# Patient Record
Sex: Male | Born: 1970
Health system: Southern US, Community
[De-identification: ages and names within clinical notes are randomized; demographics above are authoritative.]

## PROBLEM LIST (undated history)

## (undated) DIAGNOSIS — I10 Essential (primary) hypertension: Secondary | ICD-10-CM

## (undated) DIAGNOSIS — K5792 Diverticulitis of intestine, part unspecified, without perforation or abscess without bleeding: Secondary | ICD-10-CM

## (undated) DIAGNOSIS — F319 Bipolar disorder, unspecified: Secondary | ICD-10-CM

## (undated) DIAGNOSIS — Z8619 Personal history of other infectious and parasitic diseases: Secondary | ICD-10-CM

## (undated) DIAGNOSIS — E782 Mixed hyperlipidemia: Secondary | ICD-10-CM

## (undated) DIAGNOSIS — H348392 Tributary (branch) retinal vein occlusion, unspecified eye, stable: Secondary | ICD-10-CM

## (undated) DIAGNOSIS — R7303 Prediabetes: Secondary | ICD-10-CM

## (undated) DIAGNOSIS — G4730 Sleep apnea, unspecified: Secondary | ICD-10-CM

## (undated) HISTORY — PX: COLOSTOMY REVERSAL: SHX5782

## (undated) HISTORY — PX: ABDOMINAL SURGERY: SHX537

## (undated) HISTORY — DX: Essential (primary) hypertension: I10

## (undated) HISTORY — DX: Prediabetes: R73.03

## (undated) HISTORY — DX: Sleep apnea, unspecified: G47.30

## (undated) HISTORY — DX: Mixed hyperlipidemia: E78.2

## (undated) HISTORY — PX: COLON RESECTION: SHX5231

## (undated) HISTORY — DX: Personal history of other infectious and parasitic diseases: Z86.19

---

## 2004-04-28 ENCOUNTER — Ambulatory Visit: Payer: Self-pay | Admitting: General Surgery

## 2007-06-07 ENCOUNTER — Inpatient Hospital Stay: Payer: Self-pay | Admitting: Internal Medicine

## 2007-06-07 ENCOUNTER — Other Ambulatory Visit: Payer: Self-pay

## 2007-07-03 ENCOUNTER — Ambulatory Visit: Payer: Self-pay | Admitting: Family Medicine

## 2008-02-13 ENCOUNTER — Ambulatory Visit: Payer: Self-pay | Admitting: Family Medicine

## 2008-03-11 ENCOUNTER — Ambulatory Visit: Payer: Self-pay | Admitting: General Surgery

## 2008-03-25 ENCOUNTER — Inpatient Hospital Stay: Payer: Self-pay | Admitting: General Surgery

## 2008-06-29 ENCOUNTER — Ambulatory Visit: Payer: Self-pay | Admitting: General Surgery

## 2008-09-02 ENCOUNTER — Ambulatory Visit: Payer: Self-pay | Admitting: General Surgery

## 2008-09-15 ENCOUNTER — Ambulatory Visit: Payer: Self-pay | Admitting: General Surgery

## 2008-09-24 ENCOUNTER — Inpatient Hospital Stay: Payer: Self-pay | Admitting: General Surgery

## 2008-10-08 ENCOUNTER — Inpatient Hospital Stay: Payer: Self-pay | Admitting: Psychiatry

## 2010-04-11 ENCOUNTER — Emergency Department (HOSPITAL_COMMUNITY)
Admission: EM | Admit: 2010-04-11 | Discharge: 2010-04-12 | Disposition: A | Discharge status: Other Acute Inpatient Hospital | Payer: Self-pay | Source: Home / Self Care | Admitting: Emergency Medicine

## 2010-04-12 ENCOUNTER — Inpatient Hospital Stay (HOSPITAL_COMMUNITY)
Admission: AD | Admit: 2010-04-12 | Discharge: 2010-04-16 | Payer: Self-pay | Source: Home / Self Care | Attending: Psychiatry | Admitting: Psychiatry

## 2010-05-16 NOTE — Discharge Summary (Signed)
  NAME:  Dale Hodges, Dale Hodges              ACCOUNT NO.:  [id number]  MEDICAL RECORD NO.:  [id number]          PATIENT TYPE:  IPS  LOCATION:  0407                          FACILITY:  [facility]  PHYSICIAN:  Eulogio Ditch, MD DATE OF BIRTH:  Jul 13, 1970  DATE OF ADMISSION:  04/12/2010 DATE OF DISCHARGE:  04/16/2010                              DISCHARGE SUMMARY   HISTORY OF PRESENT ILLNESS:  A 40 year old male, who was admitted voluntarily as he was not sleeping properly.  He was sleeping only 4 hours for the last 4 days.  He had also taken Depakote that was given to him by another relative.  The patient's wife reported that he had gone for several days without taking his lithium, not sleeping and getting very anxious.  He was unable to clearly articulate his reason for being here himself.  He was making some nonsensical statements.  He was also hyperreligious and restless at the time of admission.  During the hospital stay, the patient was started back on the lithium 300 mg twice a day.  The patient has been taking lithium for many years and had a good response to the lithium.  The patient was also on one-to- one for inappropriate behavior, which was discontinued on April 09, 2010.  On April 15, 2010, the patient was also started on Seroquel 100 mg p.o. bedtime.  On April 16, 2010, as the patient was feeling much better, psychiatrically stable, not suicidal or homicidal, not having any psychotic or manic symptoms, the patient was discharged to follow up in the outpatient setting.  DISCHARGE DIAGNOSES:  Bipolar disorder, type 1.  DISCHARGE MEDICATIONS:  Lithium 300 mg twice daily, Seroquel 100 mg by mouth at bedtime.  DISCHARGE FOLLOWUP:  The patient will follow up with Dr. Tomasa Rand. The patient will call Dr. Tomasa Rand to make an appointment.     Eulogio Ditch, MD     SA/MEDQ  D:  05/12/2010  T:  05/12/2010  Job:  [id number]  Electronically Signed by Eulogio Ditch  on 05/16/2010 06:20:16 PM

## 2010-06-27 LAB — CBC
HCT: 47.2 % (ref 39.0–52.0)
Hemoglobin: 15.9 g/dL (ref 13.0–17.0)
MCH: 30.6 pg (ref 26.0–34.0)
MCHC: 33.7 g/dL (ref 30.0–36.0)
MCV: 90.8 fL (ref 78.0–100.0)
Platelets: 244 10*3/uL (ref 150–400)
RBC: 5.2 MIL/uL (ref 4.22–5.81)
RDW: 13.6 % (ref 11.5–15.5)
WBC: 15.5 10*3/uL — ABNORMAL HIGH (ref 4.0–10.5)

## 2010-06-27 LAB — DIFFERENTIAL
Basophils Absolute: 0 10*3/uL (ref 0.0–0.1)
Basophils Relative: 0 % (ref 0–1)
Eosinophils Absolute: 0.1 10*3/uL (ref 0.0–0.7)
Eosinophils Relative: 0 % (ref 0–5)
Lymphocytes Relative: 19 % (ref 12–46)
Lymphs Abs: 3 10*3/uL (ref 0.7–4.0)
Monocytes Absolute: 1 10*3/uL (ref 0.1–1.0)
Monocytes Relative: 6 % (ref 3–12)
Neutro Abs: 11.5 10*3/uL — ABNORMAL HIGH (ref 1.7–7.7)
Neutrophils Relative %: 74 % (ref 43–77)

## 2010-06-27 LAB — RAPID URINE DRUG SCREEN, HOSP PERFORMED
Amphetamines: NOT DETECTED
Barbiturates: NOT DETECTED
Benzodiazepines: NOT DETECTED
Cocaine: NOT DETECTED
Opiates: NOT DETECTED
Tetrahydrocannabinol: NOT DETECTED

## 2010-06-27 LAB — BASIC METABOLIC PANEL
BUN: 11 mg/dL (ref 6–23)
CO2: 25 mEq/L (ref 19–32)
Calcium: 9.2 mg/dL (ref 8.4–10.5)
Chloride: 103 mEq/L (ref 96–112)
Creatinine, Ser: 1.02 mg/dL (ref 0.4–1.5)
GFR calc Af Amer: >60 mL/min (ref >60)
GFR calc non Af Amer: >60 mL/min (ref >60)
Glucose, Bld: 120 mg/dL — ABNORMAL HIGH (ref 70–99)
Potassium: 3.5 mEq/L (ref 3.5–5.1)
Sodium: 138 mEq/L (ref 135–145)

## 2010-06-27 LAB — URINALYSIS, ROUTINE W REFLEX MICROSCOPIC
Bilirubin Urine: NEGATIVE
Glucose, UA: NEGATIVE mg/dL
Hgb urine dipstick: NEGATIVE
Nitrite: NEGATIVE
Protein, ur: NEGATIVE mg/dL
Specific Gravity, Urine: 1.004 — ABNORMAL LOW (ref 1.005–1.030)
Urobilinogen, UA: 0.2 mg/dL (ref 0.0–1.0)
pH: 5.5 (ref 5.0–8.0)

## 2010-06-27 LAB — LITHIUM LEVEL: Lithium Lvl: <0.25 mEq/L — ABNORMAL LOW (ref 0.80–1.40)

## 2010-06-27 LAB — VALPROIC ACID LEVEL: Valproic Acid Lvl: 41.6 ug/mL — ABNORMAL LOW (ref 50.0–100.0)

## 2010-06-27 LAB — ETHANOL: Alcohol, Ethyl (B): <5 mg/dL (ref 0–10)

## 2013-01-12 ENCOUNTER — Encounter (HOSPITAL_BASED_OUTPATIENT_CLINIC_OR_DEPARTMENT_OTHER): Payer: Self-pay | Admitting: *Deleted

## 2013-01-12 ENCOUNTER — Emergency Department (HOSPITAL_BASED_OUTPATIENT_CLINIC_OR_DEPARTMENT_OTHER)
Admission: EM | Admit: 2013-01-12 | Discharge: 2013-01-12 | Disposition: A | Discharge status: Home / Self Care | Payer: BC Managed Care – PPO | Attending: Emergency Medicine | Admitting: Emergency Medicine

## 2013-01-12 DIAGNOSIS — S61509A Unspecified open wound of unspecified wrist, initial encounter: Secondary | ICD-10-CM | POA: Insufficient documentation

## 2013-01-12 DIAGNOSIS — S61411A Laceration without foreign body of right hand, initial encounter: Secondary | ICD-10-CM

## 2013-01-12 DIAGNOSIS — F319 Bipolar disorder, unspecified: Secondary | ICD-10-CM | POA: Insufficient documentation

## 2013-01-12 DIAGNOSIS — Z23 Encounter for immunization: Secondary | ICD-10-CM | POA: Insufficient documentation

## 2013-01-12 DIAGNOSIS — W268XXA Contact with other sharp object(s), not elsewhere classified, initial encounter: Secondary | ICD-10-CM | POA: Insufficient documentation

## 2013-01-12 DIAGNOSIS — Z87891 Personal history of nicotine dependence: Secondary | ICD-10-CM | POA: Insufficient documentation

## 2013-01-12 DIAGNOSIS — Y9389 Activity, other specified: Secondary | ICD-10-CM | POA: Insufficient documentation

## 2013-01-12 DIAGNOSIS — Z8719 Personal history of other diseases of the digestive system: Secondary | ICD-10-CM | POA: Insufficient documentation

## 2013-01-12 DIAGNOSIS — Y929 Unspecified place or not applicable: Secondary | ICD-10-CM | POA: Insufficient documentation

## 2013-01-12 DIAGNOSIS — Z79899 Other long term (current) drug therapy: Secondary | ICD-10-CM | POA: Insufficient documentation

## 2013-01-12 HISTORY — DX: Diverticulitis of intestine, part unspecified, without perforation or abscess without bleeding: K57.92

## 2013-01-12 HISTORY — DX: Bipolar disorder, unspecified: F31.9

## 2013-01-12 MED ORDER — TETANUS-DIPHTH-ACELL PERTUSSIS 5-2.5-18.5 LF-MCG/0.5 IM SUSP
0.5000 mL | Freq: Once | INTRAMUSCULAR | Status: AC
  Administered 2013-01-12: 0.5 mL via INTRAMUSCULAR
  Filled 2013-01-12: qty 0.5

## 2013-01-12 NOTE — ED Notes (Signed)
PA at bedside suturing pt. 

## 2013-01-12 NOTE — ED Provider Notes (Signed)
CSN: [id number]     Arrival date & time 01/12/13  1544 History   First MD Initiated Contact with Patient 01/12/13 1547     Chief Complaint  Patient presents with  . Extremity Laceration   (Consider location/radiation/quality/duration/timing/severity/associated sxs/prior Treatment) HPI Comments: Patient presents with complaint of right hand and wrist laceration that began acutely just prior to arrival when he was working on a chimney and the metal insert slice into his hand. Patient rinsed the wound with water. No other treatments prior to arrival other than pressure. Patient denies numbness, tingling, weakness. Onset of symptoms acute. Course is constant. Nothing makes symptoms better worse.  The history is provided by the patient.    Past Medical History  Diagnosis Date  . Bipolar 1 disorder   . Diverticulitis    Past Surgical History  Procedure Laterality Date  . Colon resection    . Colostomy reversal     No family history on file. History  Substance Use Topics  . Smoking status: Former Games developer  . Smokeless tobacco: Never Used  . Alcohol Use: Yes     Comment: occasional    Review of Systems  Constitutional: Negative for activity change.  HENT: Negative for neck pain.   Musculoskeletal: Negative for back pain, joint swelling, arthralgias and gait problem.  Skin: Positive for wound.  Neurological: Negative for weakness and numbness.    Allergies  Codeine  Home Medications   Current Outpatient Rx  Name  Route  Sig  Dispense  Refill  . lithium 600 MG capsule   Oral   Take 600 mg by mouth 2 (two) times daily with a meal.         . QUEtiapine (SEROQUEL) 50 MG tablet   Oral   Take 50 mg by mouth at bedtime.          BP 153/102  Pulse 83  Temp(Src) 99.3 F (37.4 C) (Oral)  Resp 20  Ht 5\' 9"  (1.753 m)  Wt 215 lb (97.523 kg)  BMI 31.74 kg/m2  SpO2 99% Physical Exam  Nursing note and vitals reviewed. Constitutional: He appears well-developed and  well-nourished.  HENT:  Head: Normocephalic and atraumatic.  Eyes: Conjunctivae are normal.  Neck: Normal range of motion. Neck supple.  Cardiovascular: Normal pulses.   Musculoskeletal: He exhibits tenderness. He exhibits no edema.       Right elbow: Normal.He exhibits normal range of motion.       Right wrist: He exhibits laceration. He exhibits normal range of motion.       Right hand: He exhibits tenderness and laceration. He exhibits normal range of motion, no bony tenderness, normal capillary refill and no deformity. Normal sensation noted. Normal strength noted.       Hands: 3 cm, curved, gaping flap, right lateral wrist. Wound is shallow and there is no tendon or other subcutaneous structural involvement. Mild oozing. There is a nearby superficial abrasion. On volar aspect of hand, 1 cm, thin flap. Hemostatic, clean, no foreign bodies.  Neurological: He is alert. No sensory deficit.  Motor, sensation, and vascular distal to the injury is fully intact.   Skin: Skin is warm and dry.  Psychiatric: He has a normal mood and affect.    ED Course  Procedures (including critical care time) Labs Review Labs Reviewed - No data to display Imaging Review No results found.  Patient seen and examined. Patient agrees to proceed with suture repair.    Vital signs reviewed and are as follows:  Filed Vitals:   01/12/13 1550  BP: 153/102  Pulse: 83  Temp: 99.3 F (37.4 C)  Resp: 20   LACERATION REPAIR Performed by: Carolee Rota Authorized by: Carolee Rota Consent: Verbal consent obtained. Risks and benefits: risks, benefits and alternatives were discussed Consent given by: patient Patient identity confirmed: provided demographic data Prepped and Draped in normal sterile fashion Wound explored  Laceration Location: R wrist  Laceration Length: 3cm  No Foreign Bodies seen or palpated  Anesthesia: local infiltration  Local anesthetic: lidocaine 2% with  epinephrine  Anesthetic total: 4 ml  Irrigation method: bulk flow, 800cc NS Amount of cleaning: copius  Skin closure: 5-0 Ethilon  Number of sutures: 7  Technique: simple interrupted  Patient tolerance: Patient tolerated the procedure well with no immediate complications.  LACERATION REPAIR Performed by: Carolee Rota Authorized by: Carolee Rota Consent: Verbal consent obtained. Risks and benefits: risks, benefits and alternatives were discussed Consent given by: patient Patient identity confirmed: provided demographic data Prepped and Draped in normal sterile fashion Wound explored  Laceration Location: R wrist, flap  Laceration Length:1cm  No Foreign Bodies seen or palpated  Anesthesia: local infiltration  Local anesthetic: lidocaine 2% with epinephrine  Anesthetic total: 2 ml  Irrigation method: bulk flow, skin scrub, 200cc NS Amount of cleaning: standard  Skin closure: 5-0 Ethilon  Number of sutures: 2  Technique: simple interrupted  Patient tolerance: Patient tolerated the procedure well with no immediate complications.  Upper extremity and hand remained neurovascularly intact.  4:32 PM Patient counseled on wound care. Patient counseled on need to return or see PCP/urgent care for suture removal in 7-10 days. Patient was urged to return to the Emergency Department urgently with worsening pain, swelling, expanding erythema especially if it streaks away from the affected area, fever, or if they have any other concerns. Patient verbalized understanding.    MDM   1. Hand laceration, right, initial encounter    Patient with hand/wrist laceration. No foreign body identified on inspection. No tendon or significant neurovascular involvement. Wound repaired without complication.    Renne Crigler, PA-C 01/12/13 1636

## 2013-01-12 NOTE — ED Provider Notes (Signed)
Medical screening examination/treatment/procedure(s) were performed by non-physician practitioner and as supervising physician I was immediately available for consultation/collaboration.   Loren Racer, MD 01/12/13 [phone]

## 2013-01-12 NOTE — ED Notes (Signed)
Laceration right hand from insert of chimney

## 2016-03-21 DIAGNOSIS — H5203 Hypermetropia, bilateral: Secondary | ICD-10-CM | POA: Diagnosis not present

## 2016-03-23 DIAGNOSIS — H34831 Tributary (branch) retinal vein occlusion, right eye, with macular edema: Secondary | ICD-10-CM | POA: Diagnosis not present

## 2016-03-23 DIAGNOSIS — H35351 Cystoid macular degeneration, right eye: Secondary | ICD-10-CM | POA: Diagnosis not present

## 2016-04-04 DIAGNOSIS — H34831 Tributary (branch) retinal vein occlusion, right eye, with macular edema: Secondary | ICD-10-CM | POA: Diagnosis not present

## 2016-04-04 DIAGNOSIS — H3581 Retinal edema: Secondary | ICD-10-CM | POA: Diagnosis not present

## 2016-04-22 ENCOUNTER — Emergency Department (HOSPITAL_BASED_OUTPATIENT_CLINIC_OR_DEPARTMENT_OTHER)
Admission: EM | Admit: 2016-04-22 | Discharge: 2016-04-22 | Disposition: A | Discharge status: Home / Self Care | Payer: BLUE CROSS/BLUE SHIELD | Attending: Emergency Medicine | Admitting: Emergency Medicine

## 2016-04-22 ENCOUNTER — Emergency Department (HOSPITAL_BASED_OUTPATIENT_CLINIC_OR_DEPARTMENT_OTHER): Payer: BLUE CROSS/BLUE SHIELD

## 2016-04-22 ENCOUNTER — Encounter (HOSPITAL_BASED_OUTPATIENT_CLINIC_OR_DEPARTMENT_OTHER): Payer: Self-pay | Admitting: Emergency Medicine

## 2016-04-22 DIAGNOSIS — I10 Essential (primary) hypertension: Secondary | ICD-10-CM | POA: Diagnosis not present

## 2016-04-22 DIAGNOSIS — R51 Headache: Secondary | ICD-10-CM | POA: Diagnosis not present

## 2016-04-22 DIAGNOSIS — Z87891 Personal history of nicotine dependence: Secondary | ICD-10-CM | POA: Insufficient documentation

## 2016-04-22 HISTORY — DX: Tributary (branch) retinal vein occlusion, unspecified eye, stable: H34.8392

## 2016-04-22 LAB — CBC WITH DIFFERENTIAL/PLATELET
Basophils Absolute: 0.1 10*3/uL (ref 0.0–0.1)
Basophils Relative: 1 %
Eosinophils Absolute: 0.4 10*3/uL (ref 0.0–0.7)
Eosinophils Relative: 4 %
HCT: 44.8 % (ref 39.0–52.0)
Hemoglobin: 14.7 g/dL (ref 13.0–17.0)
Lymphocytes Relative: 33 %
Lymphs Abs: 3.3 10*3/uL (ref 0.7–4.0)
MCH: 30.1 pg (ref 26.0–34.0)
MCHC: 32.8 g/dL (ref 30.0–36.0)
MCV: 91.6 fL (ref 78.0–100.0)
Monocytes Absolute: 0.8 10*3/uL (ref 0.1–1.0)
Monocytes Relative: 8 %
Neutro Abs: 5.2 10*3/uL (ref 1.7–7.7)
Neutrophils Relative %: 54 %
Platelets: 169 10*3/uL (ref 150–400)
RBC: 4.89 MIL/uL (ref 4.22–5.81)
RDW: 13.2 % (ref 11.5–15.5)
WBC: 9.7 10*3/uL (ref 4.0–10.5)

## 2016-04-22 LAB — BASIC METABOLIC PANEL
Anion gap: 7 (ref 5–15)
BUN: 13 mg/dL (ref 6–20)
CO2: 27 mmol/L (ref 22–32)
Calcium: 8.6 mg/dL — ABNORMAL LOW (ref 8.9–10.3)
Chloride: 103 mmol/L (ref 101–111)
Creatinine, Ser: 0.68 mg/dL (ref 0.61–1.24)
GFR calc Af Amer: >60 mL/min (ref >60)
GFR calc non Af Amer: >60 mL/min (ref >60)
Glucose, Bld: 111 mg/dL — ABNORMAL HIGH (ref 65–99)
Potassium: 3.2 mmol/L — ABNORMAL LOW (ref 3.5–5.1)
Sodium: 137 mmol/L (ref 135–145)

## 2016-04-22 MED ORDER — AMLODIPINE BESYLATE 10 MG PO TABS: ORAL_TABLET | ORAL | 0 refills | Status: DC

## 2016-04-22 MED ORDER — POTASSIUM CHLORIDE CRYS ER 20 MEQ PO TBCR
40.0000 meq | EXTENDED_RELEASE_TABLET | Freq: Once | ORAL | Status: AC
  Administered 2016-04-22: 40 meq via ORAL
  Filled 2016-04-22: qty 2

## 2016-04-22 MED ORDER — ENALAPRILAT 1.25 MG/ML IV SOLN
1.2500 mg | Freq: Once | INTRAVENOUS | Status: AC
  Administered 2016-04-22: 1.25 mg via INTRAVENOUS
  Filled 2016-04-22: qty 2

## 2016-04-22 NOTE — ED Notes (Signed)
Pt verbalizes understanding of d/c instructions and denies any further needs at this time. 

## 2016-04-22 NOTE — ED Provider Notes (Addendum)
[facility] Provider Note: Lowella DellJ. Lane Loneta Tamplin, MD, FACEP  CSN: [id number] MRN: [id number] ARRIVAL: 04/22/16 at 0051 ROOM: [facility]02/[facility]02   CHIEF COMPLAINT  Headache   HISTORY OF PRESENT ILLNESS  Dale Hodges is a 46 y.o. male without a previous history of hypertension. He did have a right branch retinal vein occlusion about a month ago for which she [place]. He has had an injection in that eye. He is not on eyedrops or antihypertensives. He is here with about a 3 day history of intermittent bifrontal headaches. The headaches are dull and moderate to severe in intensity. He has been taking naproxen with partial relief. He has had no associated neurologic symptoms apart from blurred vision in his right eye unchanged since his occlusion. He denies chest pain, shortness of breath, nausea or vomiting.   Past Medical History:  Diagnosis Date  . Bipolar 1 disorder (HCC)   . Diverticulitis   . 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 ABDOMINAL SURGERY    . COLON RESECTION    . COLOSTOMY REVERSAL      History reviewed. No pertinent family history.  Social History  Substance Use Topics  . Smoking status: Former Games developermoker  . Smokeless tobacco: Never Used  . Alcohol use Yes     Comment: occasional    Prior to Admission medications   Medication Sig Start Date End Date Taking? Authorizing Provider  lithium 600 MG capsule Take 600 mg by mouth 2 (two) times daily with a meal.    Historical Provider, MD  QUEtiapine (SEROQUEL) 50 MG tablet Take 50 mg by mouth at bedtime.    Historical Provider, MD    Allergies Codeine   REVIEW OF SYSTEMS  Negative except as noted here or in the History of Present Illness.   PHYSICAL EXAMINATION  Initial Vital Signs Blood pressure (!) 204/125, pulse 74, temperature 97.8 F (36.6 C), temperature source Oral, resp. rate 18, height 5\' 10"   (1.778 m), weight 225 lb (102.1 kg), SpO2 99 %.  Examination General: Well-developed, well-nourished male in no acute distress; appearance consistent with age of record HENT: normocephalic; atraumatic Eyes: pupils equal, round and reactive to light; extraocular muscles intact Neck: supple Heart: regular rate and rhythm\ Lungs: clear to auscultation bilaterally Abdomen: soft; nondistended; nontender; no masses or hepatosplenomegaly; bowel sounds present Extremities: No deformity; full range of motion; pulses normal Neurologic: Awake, alert and oriented; motor function intact in all extremities and symmetric; no facial droop; normal coordination, gait and speech Skin: Warm and dry Psychiatric: Normal mood and affect   RESULTS  Summary of this visit's results, reviewed by myself:   EKG Interpretation  Date/Time:  Saturday April 22 2016 01:02:23 EST Ventricular Rate:  67 PR Interval:    QRS Duration: 93 QT Interval:  399 QTC Calculation: 422 R Axis:   54 Text Interpretation:  Sinus rhythm Baseline wander in lead(s) V3 Rate is slower Confirmed by Alucard Fearnow  MD, Jonny RuizJOHN ([id number]) on 04/22/2016 1:20:25 AM      Laboratory Studies: Results for orders placed or performed during the hospital encounter of 04/22/16 (from the past 24 hour(s))  Basic metabolic panel     Status: Abnormal   Collection Time: 04/22/16  2:16 AM  Result Value Ref Range   Sodium 137 135 - 145 mmol/L   Potassium 3.2 (L) 3.5 - 5.1 mmol/L   Chloride  103 101 - 111 mmol/L   CO2 27 22 - 32 mmol/L   Glucose, Bld 111 (H) 65 - 99 mg/dL   BUN 13 6 - 20 mg/dL   Creatinine, Ser 1.61 0.61 - 1.24 mg/dL   Calcium 8.6 (L) 8.9 - 10.3 mg/dL   GFR calc non Af Amer >60 >60 mL/min   GFR calc Af Amer >60 >60 mL/min   Anion gap 7 5 - 15  CBC with Differential/Platelet     Status: None   Collection Time: 04/22/16  2:16 AM  Result Value Ref Range   WBC 9.7 4.0 - 10.5 K/uL   RBC 4.89 4.22 - 5.81 MIL/uL   Hemoglobin 14.7 13.0 - 17.0  g/dL   HCT 09.6 04.5 - 40.9 %   MCV 91.6 78.0 - 100.0 fL   MCH 30.1 26.0 - 34.0 pg   MCHC 32.8 30.0 - 36.0 g/dL   RDW 81.1 91.4 - 78.2 %   Platelets 169 150 - 400 K/uL   Neutrophils Relative % 54 %   Neutro Abs 5.2 1.7 - 7.7 K/uL   Lymphocytes Relative 33 %   Lymphs Abs 3.3 0.7 - 4.0 K/uL   Monocytes Relative 8 %   Monocytes Absolute 0.8 0.1 - 1.0 K/uL   Eosinophils Relative 4 %   Eosinophils Absolute 0.4 0.0 - 0.7 K/uL   Basophils Relative 1 %   Basophils Absolute 0.1 0.0 - 0.1 K/uL   Imaging Studies: Ct Head Wo Contrast  Result Date: 04/22/2016 CLINICAL DATA:  Headache for several days EXAM: CT HEAD WITHOUT CONTRAST TECHNIQUE: Contiguous axial images were obtained from the base of the skull through the vertex without intravenous contrast. COMPARISON:  10/08/2008 FINDINGS: Brain: No evidence of acute infarction, hemorrhage, hydrocephalus, extra-axial collection or mass lesion/mass effect. Vascular: No hyperdense vessel or unexpected calcification. Skull: Normal. Negative for fracture or focal lesion. Sinuses/Orbits: Mucosal thickening within the ethmoid sinuses. No acute orbital abnormality peer Other: None IMPRESSION: No CT evidence for acute intracranial abnormality Electronically Signed   By: Jasmine Pang M.D.   On: 04/22/2016 02:13    ED COURSE  Nursing notes and initial vitals signs, including pulse oximetry, reviewed.  Vitals:   04/22/16 0055 04/22/16 0058 04/22/16 0230  BP:  (!) 204/125 (!) 177/127  Pulse:  74 74  Resp:  18 22  Temp:  97.8 F (36.6 C)   TempSrc:  Oral   SpO2:  99% 98%  Weight: 225 lb (102.1 kg)    Height: 5\' 10"  (1.778 m)     Given recent branch retinal vein occlusion we will start him on initial therapy for hypertension. Will have him follow-up with his primary care physician at [facility] for further evaluation and treatment.  PROCEDURES    ED DIAGNOSES     ICD-9-CM ICD-10-CM   1. Hypertension, unspecified type 401.9 I10        Paula Libra, MD 04/22/16 0300    Paula Libra, MD 04/22/16 9562

## 2016-04-22 NOTE — ED Triage Notes (Signed)
Patient reports that he has had a a headache over the last several days with his increased stress levels that he has experienced. Reports that his HR was elevated on his smart watch as high as 87  - he took his Bp at home and it was elevated

## 2016-04-22 NOTE — ED Notes (Signed)
Patient transported to CT 

## 2016-04-22 NOTE — ED Notes (Signed)
Pt c/o intermittent headaches for the last several days that don't completely go away with OTC medications as they would normally do.  Pt became concerned this evening because his blood pressure was very elevated and he has had a recent branch retinal vein occlusion in his right eye.  Pt denies numbness, denies tingling, denies weakness or speech slurring.

## 2016-04-26 DIAGNOSIS — I10 Essential (primary) hypertension: Secondary | ICD-10-CM | POA: Diagnosis not present

## 2016-04-26 DIAGNOSIS — I160 Hypertensive urgency: Secondary | ICD-10-CM | POA: Diagnosis not present

## 2016-05-11 DIAGNOSIS — H3581 Retinal edema: Secondary | ICD-10-CM | POA: Diagnosis not present

## 2016-05-11 DIAGNOSIS — H34831 Tributary (branch) retinal vein occlusion, right eye, with macular edema: Secondary | ICD-10-CM | POA: Diagnosis not present

## 2016-05-11 DIAGNOSIS — H35351 Cystoid macular degeneration, right eye: Secondary | ICD-10-CM | POA: Diagnosis not present

## 2016-05-12 ENCOUNTER — Ambulatory Visit (INDEPENDENT_AMBULATORY_CARE_PROVIDER_SITE_OTHER): Payer: BLUE CROSS/BLUE SHIELD | Admitting: Family Medicine

## 2016-05-12 ENCOUNTER — Encounter: Payer: Self-pay | Admitting: Family Medicine

## 2016-05-12 VITALS — BP 182/90 | HR 94 | Temp 98.3°F | Ht 70.0 in | Wt 226.4 lb

## 2016-05-12 DIAGNOSIS — I10 Essential (primary) hypertension: Secondary | ICD-10-CM

## 2016-05-12 DIAGNOSIS — Z23 Encounter for immunization: Secondary | ICD-10-CM

## 2016-05-12 MED ORDER — LITHIUM CARBONATE 600 MG PO CAPS: 600.0000 mg | ORAL_CAPSULE | Freq: Two times a day (BID) | ORAL | 1 refills | Status: DC

## 2016-05-12 MED ORDER — LISINOPRIL 20 MG PO TABS: 20.0000 mg | ORAL_TABLET | Freq: Every day | ORAL | 1 refills | Status: DC

## 2016-05-12 MED ORDER — AMLODIPINE BESYLATE 10 MG PO TABS: ORAL_TABLET | ORAL | 1 refills | Status: DC

## 2016-05-12 NOTE — Progress Notes (Signed)
Chief Complaint  Patient presents with  . Establish Care    pt needing medication refills for BP       New Patient Visit SUBJECTIVE: HPI: Dale Hodges is an 46 y.o.male who is being seen for establishing care.  The patient [facility]  Hypertension Patient presents for hypertension follow up. He was dx'd 3 weeks ago in ED after having headaches. He does monitor home blood pressures. Blood pressures ranging on average from 170's/100's. This is vastly improved from previous before Norvasc He is compliant with medications- Norvasc 10 mg daily. Patient has these side effects of medication: fatigue, though this is improving He is adhering to a healthy diet overall. Exercise: none currently He specifically denies CP, LE edema or SOB.  He used to see a provider who rx'd him lithium for mood. This provider has since retired and he would like to have a refill as he is bridged to another psychiatrist.   Allergies  Allergen Reactions  . Codeine     Triggers manic episode    Past Medical History:  Diagnosis Date  . Bipolar 1 disorder (HCC)   . Branch retinal vein occlusion    right eye  . Diverticulitis   . History of chicken pox   . Hypertension    Past Surgical History:  Procedure Laterality Date  . ABDOMINAL SURGERY    . COLON RESECTION    . COLOSTOMY REVERSAL     Social History   Social History  . Marital status: Married   Social History Main Topics  . Smoking status: Former Games developermoker  . Smokeless tobacco: Never Used  . Alcohol use Yes     Comment: occasional  . Drug use: No   Family History  Problem Relation Age of Onset  . Hypertension Mother   . Cancer Sister   . Diabetes Sister   . Hypertension Brother      Current Outpatient Prescriptions:  .  amLODipine (NORVASC) 10 MG tablet, Take one half tablet daily for one week and then one tablet daily thereafter., Disp: 90 tablet, Rfl: 1 .  lithium 600 MG capsule, Take 1  capsule (600 mg total) by mouth 2 (two) times daily with a meal., Disp: 180 capsule, Rfl: 1 .  QUEtiapine (SEROQUEL) 50 MG tablet, Take 50 mg by mouth at bedtime., Disp: , Rfl:  .  lisinopril (PRINIVIL,ZESTRIL) 20 MG tablet, Take 1 tablet (20 mg total) by mouth daily., Disp: 30 tablet, Rfl: 1  ROS  Cardiovascular: Denies chest pain  Respiratory: Denies dyspnea   OBJECTIVE: BP (!) 182/90 (BP Location: Left Arm, Patient Position: Sitting, Cuff Size: Large)   Pulse 94   Temp 98.3 F (36.8 C) (Oral)   Ht 5\' 10"  (1.778 m)   Wt 226 lb 6.4 oz (102.7 kg)   SpO2 99%   BMI 32.49 kg/m   Constitutional: -  VS reviewed -  Well developed, well nourished, appears stated age -  No apparent distress  Psychiatric: -  Oriented to person, place, and time -  Memory intact -  Affect and mood normal -  Fluent conversation, good eye contact -  Judgment and insight age appropriate  ENMT: -  Ears are patent b/l without erythema or discharge. TM's are shiny and clear b/l without evidence of effusion or infection. -  Oral mucosa without lesions, tongue and uvula midline    Tonsils not enlarged, no erythema, no exudate, trachea midline    Pharynx moist, no lesions,  no erythema  Neck: -  No gross swelling, no palpable masses -  Thyroid midline, not enlarged, mobile, no palpable masses  Cardiovascular: -  RRR, no murmurs -  2+ pitting edema up to prox 1/3 of tibia b/l  Respiratory: -  Normal respiratory effort, no accessory muscle use, no retraction -  Breath sounds equal, no wheezes, no ronchi, no crackles  Musculoskeletal: -  No clubbing, no cyanosis -  Gait normal  Skin: -  No significant lesion on inspection -  Warm and dry to palpation   ASSESSMENT/PLAN: Essential hypertension - Plan: lisinopril (PRINIVIL,ZESTRIL) 20 MG tablet, Basic Metabolic Panel (BMET), amLODipine (NORVASC) 10 MG tablet  Encounter for immunization - Plan: Flu Vaccine QUAD 36+ mos IM  Patient instructed to sign release of  records form from his previous PCP. Counseled on diet and exercise. OK to exercise as long as he is not having exertional symptoms.  Record BP's at home, different times of the day when possible. Bring to next appt. I'm OK with refilling lithium until he can get established with psych. Patient should return in 4 weeks- 1 week to recheck labs. The patient voiced understanding and agreement to the plan.  Jilda Roche Rincon, DO 05/12/16  3:40 PM

## 2016-05-12 NOTE — Patient Instructions (Signed)
Check BP at home 2-3 times per week at different times of the day. Bring your log to your next appointment. Bring your cuff to your appointment as well.

## 2016-05-18 ENCOUNTER — Other Ambulatory Visit: Payer: Self-pay | Admitting: *Deleted

## 2016-05-18 ENCOUNTER — Other Ambulatory Visit: Payer: BLUE CROSS/BLUE SHIELD

## 2016-05-18 DIAGNOSIS — I10 Essential (primary) hypertension: Secondary | ICD-10-CM | POA: Diagnosis not present

## 2016-05-18 LAB — BASIC METABOLIC PANEL
BUN: 12 mg/dL (ref 7–25)
CO2: 28 mmol/L (ref 20–31)
Calcium: 8.6 mg/dL (ref 8.6–10.3)
Chloride: 101 mmol/L (ref 98–110)
Creat: 1.06 mg/dL (ref 0.60–1.35)
Glucose, Bld: 102 mg/dL — ABNORMAL HIGH (ref 65–99)
Potassium: 3.5 mmol/L (ref 3.5–5.3)
Sodium: 140 mmol/L (ref 135–146)

## 2016-05-18 NOTE — Telephone Encounter (Signed)
Called [facility] and spoke with Onalee Huaavid in the pharmacy and verbally gave him the approval to change to the new manufacturer for the Lithium Carbonate 600mg .  Informed him that the strength,directions,quantity, and refills stay the same.  Per Dr. Carmelia RollerWendling.  Onalee HuaDavid verbalized understanding.//AB/CMA

## 2016-05-22 ENCOUNTER — Encounter: Payer: Self-pay | Admitting: *Deleted

## 2016-06-09 ENCOUNTER — Ambulatory Visit (INDEPENDENT_AMBULATORY_CARE_PROVIDER_SITE_OTHER): Payer: BLUE CROSS/BLUE SHIELD | Admitting: Family Medicine

## 2016-06-09 ENCOUNTER — Encounter: Payer: Self-pay | Admitting: Family Medicine

## 2016-06-09 VITALS — BP 140/92 | HR 82 | Temp 98.1°F | Ht 70.0 in | Wt 224.6 lb

## 2016-06-09 DIAGNOSIS — I10 Essential (primary) hypertension: Secondary | ICD-10-CM

## 2016-06-09 MED ORDER — LOSARTAN POTASSIUM 100 MG PO TABS: 100.0000 mg | ORAL_TABLET | Freq: Every day | ORAL | 1 refills | Status: DC

## 2016-06-09 MED ORDER — METOPROLOL SUCCINATE ER 50 MG PO TB24: 50.0000 mg | ORAL_TABLET | Freq: Every day | ORAL | 1 refills | Status: DC

## 2016-06-09 NOTE — Patient Instructions (Addendum)
Around 3 times per week, check your blood pressure 4 times per day. Twice in the morning and twice in the evening. The readings should be at least one minute apart. Write down these values and bring them to your next nurse visit/appointment.  When you check your BP, make sure you have been doing something calm/relaxing 5 minutes prior to checking. Both feet should be flat on the floor and you should be sitting. Use your left arm and make sure it is in a relaxed position (on a table), and that the cuff is at the approximate level/height of your heart.  Continue Norvasc. If losartan is too expensive/not covered by insurance, use the metoprolol. I do not want you taking losartan AND metoprolol at this time.  Keep up the great work with diet changes and execise.

## 2016-06-09 NOTE — Progress Notes (Signed)
Chief Complaint  Patient presents with  . Follow-up    on BP-pt has BP readings-pt stated that he that he stopped the Lisinopril b/c it made him feel bad.    Subjective Dale Hodges Dale Hodges is a 46 y.o. male who presents for hypertension follow up. He does monitor home blood pressures. Blood pressures ranging from 130-140's/90's on average. He is compliant with medications - Norvasc 5 mg daily. He was started on Lisinopril, but did not like the way it made him feel. Patient has these side effects of medication: none He is adhering to a healthy diet overall. Current exercise: starting to walk more, in process of getting home gym with leg extension/curl, pull down   Past Medical History:  Diagnosis Date  . Bipolar 1 disorder (HCC)   . Branch retinal vein occlusion    right eye  . Diverticulitis   . History of chicken pox   . Hypertension    Family History  Problem Relation Age of Onset  . Hypertension Mother   . Cancer Sister   . Diabetes Sister   . Hypertension Brother     Medications Current Outpatient Prescriptions on File Prior to Visit  Medication Sig Dispense Refill  . lithium 600 MG capsule Take 1 capsule (600 mg total) by mouth 2 (two) times daily with a meal. 180 capsule 1  . QUEtiapine (SEROQUEL) 50 MG tablet Take 50 mg by mouth at bedtime.     Allergies Allergies  Allergen Reactions  . Codeine     Triggers manic episode    Review of Systems Cardiovascular: no chest pain Respiratory:  no shortness of breath  Exam BP (!) 140/92 (BP Location: Left Arm, Patient Position: Sitting, Cuff Size: Large)   Pulse 82   Temp 98.1 F (36.7 C) (Oral)   Ht 5\' 10"  (1.778 m)   Wt 224 lb 9.6 oz (101.9 kg)   SpO2 99%   BMI 32.23 kg/m  General:  well developed, well nourished, in no apparent distress Skin:  warm, no pallor or diaphoresis Eyes:  pupils equal and round, sclera anicteric without injection Heart :RRR, no murmurs, no bruits, 2+ pitting edema b/l up to mid  tibia b/l Lungs:  clear to auscultation, no accessory muscle use Psych: well oriented with normal range of affect and appropriate judgment/insight  Essential hypertension - Plan: losartan (COZAAR) 100 MG tablet, metoprolol succinate (TOPROL-XL) 50 MG 24 hr tablet  Orders as above. Goal is <140/90. Start Losartan in addition to Norvasc. If not covered, will fill Toprol instead. Counseled on diet and exercise- doing great with weight loss, states he has lost 6 lbs on his scale. F/u in 1 mo. The patient voiced understanding and agreement to the plan.  Jilda Rocheicholas Paul [place], DO 06/09/16  3:46 PM

## 2016-07-10 ENCOUNTER — Ambulatory Visit (INDEPENDENT_AMBULATORY_CARE_PROVIDER_SITE_OTHER): Payer: BLUE CROSS/BLUE SHIELD | Admitting: Family Medicine

## 2016-07-10 ENCOUNTER — Encounter: Payer: Self-pay | Admitting: Family Medicine

## 2016-07-10 VITALS — BP 120/90 | HR 69 | Temp 98.4°F | Ht 70.0 in | Wt 229.0 lb

## 2016-07-10 DIAGNOSIS — I10 Essential (primary) hypertension: Secondary | ICD-10-CM

## 2016-07-10 MED ORDER — AMLODIPINE BESYLATE 10 MG PO TABS: 10.0000 mg | ORAL_TABLET | Freq: Every day | ORAL | 5 refills | Status: DC

## 2016-07-10 MED ORDER — METOPROLOL SUCCINATE ER 50 MG PO TB24: 50.0000 mg | ORAL_TABLET | Freq: Every day | ORAL | 5 refills | Status: DC

## 2016-07-10 NOTE — Patient Instructions (Signed)
Keep up the great work with your weight loss.  One thing you could try is taking one medicine at night and the other 2 in the morning. If you won't remember, I would rather you take them all at once routinely.

## 2016-07-10 NOTE — Progress Notes (Signed)
Chief Complaint  Patient presents with  . Follow-up    on HTN    Subjective Dale Hodges is a 46 y.o. male who presents for hypertension follow up. He does monitor home blood pressures. Blood pressures ranging from 130-140's/80-90's on average. He is compliant with medications- Toprol 50 XL mg and Norvasc 10 mg daily. He did not pick up the Losartan per my instructions as we were just adding one.  Patient has these side effects of medication: none He is adhering to a healthy diet overall. Current exercise: intermittent   Past Medical History:  Diagnosis Date  . Bipolar 1 disorder (HCC)   . Branch retinal vein occlusion    right eye  . Diverticulitis   . History of chicken pox   . Hypertension    Family History  Problem Relation Age of Onset  . Hypertension Mother   . Cancer Sister   . Diabetes Sister   . Hypertension Brother      Medications Current Outpatient Prescriptions on File Prior to Visit  Medication Sig Dispense Refill  . amLODipine (NORVASC) 10 MG tablet Take 10 mg by mouth daily.    Marland Kitchen. lithium 600 MG capsule Take 1 capsule (600 mg total) by mouth 2 (two) times daily with a meal. 180 capsule 1  . metoprolol succinate (TOPROL-XL) 50 MG 24 hr tablet Take 1 tablet (50 mg total) by mouth daily. Take with or immediately following a meal. 30 tablet 1  . QUEtiapine (SEROQUEL) 50 MG tablet Take 50 mg by mouth at bedtime.     Allergies Allergies  Allergen Reactions  . Codeine     Triggers manic episode    Review of Systems Cardiovascular: no chest pain Respiratory:  no shortness of breath  Exam BP 120/90 (BP Location: Left Arm, Patient Position: Sitting, Cuff Size: Large)   Pulse 69   Temp 98.4 F (36.9 C) (Oral)   Ht 5\' 10"  (1.778 m)   Wt 229 lb (103.9 kg)   SpO2 98%   BMI 32.86 kg/m  General:  well developed, well nourished, in no apparent distress Skin:  warm, no pallor or diaphoresis Eyes:  pupils equal and round, sclera anicteric without  injection Heart :RRR, no murmurs, no bruits, no LE edema Lungs:  clear to auscultation, no accessory muscle use Psych: well oriented with normal range of affect and appropriate judgment/insight  Essential hypertension - Plan: amLODipine (NORVASC) 10 MG tablet, metoprolol succinate (TOPROL-XL) 50 MG 24 hr tablet, losartan (COZAAR) 100 MG tablet, Basic Metabolic Panel (BMET), Basic Metabolic Panel (BMET)  Orders as above. Add Losartan. Counseled on diet and exercise F/u in 1 mo. 1 week for BMP. The patient voiced understanding and agreement to the plan.  Jilda Rocheicholas Paul SherrelwoodWendling, DO 07/10/16  3:34 PM

## 2016-07-10 NOTE — Progress Notes (Signed)
Pre visit review using our clinic review tool, if applicable. No additional management support is needed unless otherwise documented below in the visit note. 

## 2016-07-11 ENCOUNTER — Encounter: Payer: Self-pay | Admitting: *Deleted

## 2016-07-11 LAB — BASIC METABOLIC PANEL
BUN: 17 mg/dL (ref 6–23)
CO2: 27 mEq/L (ref 19–32)
Calcium: 9.3 mg/dL (ref 8.4–10.5)
Chloride: 100 mEq/L (ref 96–112)
Creatinine, Ser: 0.98 mg/dL (ref 0.40–1.50)
GFR: 88.29 mL/min (ref >60.00)
Glucose, Bld: 105 mg/dL — ABNORMAL HIGH (ref 70–99)
Potassium: 3.7 mEq/L (ref 3.5–5.1)
Sodium: 137 mEq/L (ref 135–145)

## 2016-07-13 DIAGNOSIS — H34831 Tributary (branch) retinal vein occlusion, right eye, with macular edema: Secondary | ICD-10-CM | POA: Diagnosis not present

## 2016-07-13 DIAGNOSIS — H35351 Cystoid macular degeneration, right eye: Secondary | ICD-10-CM | POA: Diagnosis not present

## 2016-07-13 DIAGNOSIS — H3581 Retinal edema: Secondary | ICD-10-CM | POA: Diagnosis not present

## 2016-07-20 ENCOUNTER — Other Ambulatory Visit (INDEPENDENT_AMBULATORY_CARE_PROVIDER_SITE_OTHER): Payer: BLUE CROSS/BLUE SHIELD

## 2016-07-20 DIAGNOSIS — I10 Essential (primary) hypertension: Secondary | ICD-10-CM

## 2016-07-21 ENCOUNTER — Encounter: Payer: Self-pay | Admitting: *Deleted

## 2016-07-21 LAB — BASIC METABOLIC PANEL
BUN: 12 mg/dL (ref 6–23)
CO2: 30 mEq/L (ref 19–32)
Calcium: 9.3 mg/dL (ref 8.4–10.5)
Chloride: 103 mEq/L (ref 96–112)
Creatinine, Ser: 0.97 mg/dL (ref 0.40–1.50)
GFR: 89.33 mL/min (ref >60.00)
Glucose, Bld: 99 mg/dL (ref 70–99)
Potassium: 4.2 mEq/L (ref 3.5–5.1)
Sodium: 140 mEq/L (ref 135–145)

## 2016-08-07 ENCOUNTER — Ambulatory Visit (INDEPENDENT_AMBULATORY_CARE_PROVIDER_SITE_OTHER): Payer: BLUE CROSS/BLUE SHIELD | Admitting: Family Medicine

## 2016-08-07 ENCOUNTER — Encounter: Payer: Self-pay | Admitting: Family Medicine

## 2016-08-07 VITALS — BP 132/88 | HR 64 | Temp 98.4°F | Ht 70.0 in | Wt 228.0 lb

## 2016-08-07 DIAGNOSIS — I10 Essential (primary) hypertension: Secondary | ICD-10-CM | POA: Diagnosis not present

## 2016-08-07 DIAGNOSIS — R600 Localized edema: Secondary | ICD-10-CM | POA: Diagnosis not present

## 2016-08-07 NOTE — Progress Notes (Signed)
Chief Complaint  Patient presents with  . Follow-up    blood pressure    Subjective Dale Hodges is a 46 y.o. male who presents for hypertension follow up. He does monitor home blood pressures. Blood pressures ranging from 130's/80's on average. He is compliant with medications- Losartan 100 mg daily, Norvasc 10 mg daily, Toprol XL 50 mg daily. Patient has these side effects of medication: none He is sometimes adhering to a healthy diet overall. Current exercise: intermittent walking   Past Medical History:  Diagnosis Date  . Bipolar 1 disorder (HCC)   . Branch retinal vein occlusion    right eye  . Diverticulitis   . History of chicken pox   . Hypertension    Family History  Problem Relation Age of Onset  . Hypertension Mother   . Cancer Sister   . Diabetes Sister   . Hypertension Brother    Medications Current Outpatient Prescriptions on File Prior to Visit  Medication Sig Dispense Refill  . amLODipine (NORVASC) 10 MG tablet Take 1 tablet (10 mg total) by mouth daily. 30 tablet 5  . lithium 600 MG capsule Take 1 capsule (600 mg total) by mouth 2 (two) times daily with a meal. 180 capsule 1  . losartan (COZAAR) 100 MG tablet Take 1 tablet (100 mg total) by mouth daily. 30 tablet 1  . metoprolol succinate (TOPROL-XL) 50 MG 24 hr tablet Take 1 tablet (50 mg total) by mouth daily. Take with or immediately following a meal. 30 tablet 5  . QUEtiapine (SEROQUEL) 50 MG tablet Take 50 mg by mouth at bedtime.     Allergies Allergies  Allergen Reactions  . Codeine     Triggers manic episode     Review of Systems Cardiovascular: no chest pain Respiratory:  no shortness of breath  Exam BP 132/88 (BP Location: Left Arm, Patient Position: Sitting, Cuff Size: Normal)   Pulse 64   Temp 98.4 F (36.9 C) (Oral)   Ht  (1.778 m)   Wt 228 lb (103.4 kg)   SpO2 98%   BMI 32.71 kg/m  General:  well developed, well nourished, in no apparent distress Skin:  warm,  no pallor or diaphoresis Eyes:  pupils equal and round, sclera anicteric without injection Heart :RRR, no murmurs, no bruits, 2+ pitting edema b/l Lungs:  clear to auscultation, no accessory muscle use Psych: well oriented with normal range of affect and appropriate judgment/insight  Essential hypertension  Bilateral leg edema  Cont Losartan, Metoprolol XL, Norvasc. Finally at goal.  Counseled on diet and exercise. Needs to get better with diet which has been affecting his swelling. Compression stocking rx given. Discussed trialing to cut down on Norvasc. F/u in 3 mo. The patient voiced understanding and agreement to the plan.  Jilda Roche Hallowell, DO 08/07/16  3:21 PM

## 2016-08-07 NOTE — Progress Notes (Signed)
Pre visit review using our clinic review tool, if applicable. No additional management support is needed unless otherwise documented below in the visit note. 

## 2016-08-07 NOTE — Patient Instructions (Addendum)
Keep up the great work.  Keep working at [employer].  Let us know if you need anything.  Give me a business week's notice for medication refills.

## 2016-09-07 DIAGNOSIS — H35351 Cystoid macular degeneration, right eye: Secondary | ICD-10-CM | POA: Diagnosis not present

## 2016-09-07 DIAGNOSIS — H3581 Retinal edema: Secondary | ICD-10-CM | POA: Diagnosis not present

## 2016-09-07 DIAGNOSIS — H34831 Tributary (branch) retinal vein occlusion, right eye, with macular edema: Secondary | ICD-10-CM | POA: Diagnosis not present

## 2016-09-11 ENCOUNTER — Other Ambulatory Visit: Payer: Self-pay | Admitting: Family Medicine

## 2016-09-11 DIAGNOSIS — I10 Essential (primary) hypertension: Secondary | ICD-10-CM

## 2016-09-13 NOTE — Telephone Encounter (Signed)
Rx approved and sent to the pharmacy by e-script.//AB/CMA 

## 2016-10-19 ENCOUNTER — Emergency Department (HOSPITAL_BASED_OUTPATIENT_CLINIC_OR_DEPARTMENT_OTHER)
Admission: EM | Admit: 2016-10-19 | Discharge: 2016-10-19 | Disposition: A | Discharge status: Home / Self Care | Payer: BLUE CROSS/BLUE SHIELD | Attending: Emergency Medicine | Admitting: Emergency Medicine

## 2016-10-19 ENCOUNTER — Encounter (HOSPITAL_BASED_OUTPATIENT_CLINIC_OR_DEPARTMENT_OTHER): Payer: Self-pay | Admitting: Emergency Medicine

## 2016-10-19 ENCOUNTER — Emergency Department (HOSPITAL_BASED_OUTPATIENT_CLINIC_OR_DEPARTMENT_OTHER): Payer: BLUE CROSS/BLUE SHIELD

## 2016-10-19 DIAGNOSIS — M545 Low back pain: Secondary | ICD-10-CM | POA: Diagnosis not present

## 2016-10-19 DIAGNOSIS — I10 Essential (primary) hypertension: Secondary | ICD-10-CM | POA: Diagnosis not present

## 2016-10-19 DIAGNOSIS — Y939 Activity, unspecified: Secondary | ICD-10-CM | POA: Insufficient documentation

## 2016-10-19 DIAGNOSIS — Y999 Unspecified external cause status: Secondary | ICD-10-CM | POA: Diagnosis not present

## 2016-10-19 DIAGNOSIS — Z79899 Other long term (current) drug therapy: Secondary | ICD-10-CM | POA: Insufficient documentation

## 2016-10-19 DIAGNOSIS — Z87891 Personal history of nicotine dependence: Secondary | ICD-10-CM | POA: Diagnosis not present

## 2016-10-19 DIAGNOSIS — Y92410 Unspecified street and highway as the place of occurrence of the external cause: Secondary | ICD-10-CM | POA: Diagnosis not present

## 2016-10-19 DIAGNOSIS — S199XXA Unspecified injury of neck, initial encounter: Secondary | ICD-10-CM | POA: Diagnosis not present

## 2016-10-19 DIAGNOSIS — M62838 Other muscle spasm: Secondary | ICD-10-CM | POA: Insufficient documentation

## 2016-10-19 DIAGNOSIS — M542 Cervicalgia: Secondary | ICD-10-CM | POA: Diagnosis not present

## 2016-10-19 MED ORDER — CYCLOBENZAPRINE HCL 5 MG PO TABS
5.0000 mg | ORAL_TABLET | Freq: Once | ORAL | Status: AC
  Administered 2016-10-19: 5 mg via ORAL
  Filled 2016-10-19: qty 1

## 2016-10-19 MED ORDER — IBUPROFEN 600 MG PO TABS: 600.0000 mg | ORAL_TABLET | Freq: Three times a day (TID) | ORAL | 0 refills | Status: DC | PRN

## 2016-10-19 MED ORDER — ACETAMINOPHEN 325 MG PO TABS
650.0000 mg | ORAL_TABLET | Freq: Once | ORAL | Status: AC
  Administered 2016-10-19: 650 mg via ORAL
  Filled 2016-10-19: qty 2

## 2016-10-19 MED ORDER — CYCLOBENZAPRINE HCL 10 MG PO TABS: 10.0000 mg | ORAL_TABLET | Freq: Three times a day (TID) | ORAL | 0 refills | Status: DC | PRN

## 2016-10-19 MED ORDER — IBUPROFEN 400 MG PO TABS
600.0000 mg | ORAL_TABLET | Freq: Once | ORAL | Status: AC
  Administered 2016-10-19: 600 mg via ORAL
  Filled 2016-10-19: qty 1

## 2016-10-19 MED FILL — CYCLOBENZAPRINE 10 MG TABLE: 10 | 4 days supply | Qty: 12 | Fill #0

## 2016-10-19 MED FILL — IBUPROFEN 600 MG TABLET: 600 | 5 days supply | Qty: 15 | Fill #0
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
## 2016-10-19 NOTE — ED Notes (Signed)
Patient transported to X-ray 
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
## 2016-11-06 ENCOUNTER — Ambulatory Visit (INDEPENDENT_AMBULATORY_CARE_PROVIDER_SITE_OTHER): Payer: BLUE CROSS/BLUE SHIELD | Admitting: Family Medicine

## 2016-11-06 ENCOUNTER — Encounter: Payer: Self-pay | Admitting: Family Medicine

## 2016-11-06 VITALS — BP 122/86 | HR 88 | Temp 98.3°F | Ht 70.0 in | Wt 232.2 lb

## 2016-11-06 DIAGNOSIS — S46812A Strain of other muscles, fascia and tendons at shoulder and upper arm level, left arm, initial encounter: Secondary | ICD-10-CM | POA: Diagnosis not present

## 2016-11-06 DIAGNOSIS — I10 Essential (primary) hypertension: Secondary | ICD-10-CM

## 2016-11-06 NOTE — Patient Instructions (Signed)
Keep up the good work.  OK to check your BP whenever you feel like it.   [facility] Ask your health care provider which exercises are safe for you. Do exercises exactly as told by your health care provider and adjust them as directed. It is normal to feel mild stretching, pulling, tightness, or discomfort as you do these exercises, but you should stop right away if you feel sudden pain or your pain gets worse.Do not begin these exercises until told by your health care provider. Stretching and range of motion exercises These exercises warm up your muscles and joints and improve the movement and flexibility of your shoulder. These exercises can also help to relieve pain, numbness, and tingling. If you are unable to do any of the following for any reason, do not further attempt to do it.  Exercise A: Flexion, standing    1. Stand and hold a broomstick, a cane, or a similar object. Place your hands a little more than shoulder-width apart on the object. Your left / right hand should be palm-up, and your other hand should be palm-down. 2. Push the stick to raise your left / right arm out to your side and then over your head. Use your other hand to help move the stick. Stop when you feel a stretch in your shoulder, or when you reach the angle that is recommended by your health care provider. ? Avoid shrugging your shoulder while you raise your arm. Keep your shoulder blade tucked down toward your spine. 3. Hold for 10-15 seconds. 4. Slowly return to the starting position. Repeat 2-3 times. Complete this exercise 1 time a day.  Exercise B: Abduction, supine    1. Lie on your back and hold a broomstick, a cane, or a similar object. Place your hands a little more than shoulder-width apart on the object. Your left / right hand should be palm-up, and your other hand should be palm-down. 2. Push the stick to raise your left / right arm out to your side and then over your head. Use your other  hand to help move the stick. Stop when you feel a stretch in your shoulder, or when you reach the angle that is recommended by your health care provider. ? Avoid shrugging your shoulder while you raise your arm. Keep your shoulder blade tucked down toward your spine. 3. Hold for 10-15 seconds. 4. Slowly return to the starting position. Repeat 2-3 times. Complete this exercise 1 time  a day.  Exercise C: Flexion, active-assisted    1. Lie on your back. You may bend your knees for comfort. 2. Hold a broomstick, a cane, or a similar object. Place your hands about shoulder-width apart on the object. Your palms should face toward your feet. 3. Raise the stick and move your arms over your head and behind your head, toward the floor. Use your healthy arm to help your left / right arm move farther. Stop when you feel a gentle stretch in your shoulder, or when you reach the angle where your health care provider tells you to stop. 4. Hold for 10-15 seconds. 5. Slowly return to the starting position. Repeat 2-3 times. Complete this exercise 1 time  a day.  Exercise D: External rotation and abduction    1. Stand in a door frame with one of your feet slightly in front of the other. This is called a staggered stance. 2. Choose one of the following positions as told by your health care  provider: ? Place your hands and forearms on the door frame above your head. ? Place your hands and forearms on the door frame at the height of your head. ? Place your hands on the door frame at the height of your elbows. 3. Slowly move your weight onto your front foot until you feel a stretch across your chest and in the front of your shoulders. Keep your head and chest upright and keep your abdominal muscles tight. 4. Hold for 10-15 seconds. 5. To release the stretch, shift your weight to your back foot. Repeat 2-3 times. Complete this stretch 1 time  a day.  Strengthening exercises These exercises build strength  and endurance in your shoulder. Endurance is the ability to use your muscles for a long time, even after your muscles get tired. Exercise E: Scapular depression and adduction  1. Sit on a stable chair. Support your arms in front of you with pillows, armrests, or a tabletop. Keep your elbows in line with the sides of your body. 2. Gently move your shoulder blades down toward your middle back. Relax the muscles on the tops of your shoulders and in the back of your neck. 3. Hold for 10-15 seconds. 4. Slowly release the tension and relax your muscles completely before doing this exercise again. 5. After you have practiced this exercise, try doing the exercise without the arm support. Then, try the exercise while standing instead of sitting. Repeat 2-3 times. Complete this exercise 1 time  a day.  Exercise F: Shoulder abduction, isometric    1. Stand or sit about 4-6 inches (10-15 cm) from a wall with your left / right side facing the wall. 2. Bend your left / right elbow and gently press your elbow against the wall. 3. Increase the pressure slowly until you are pressing as hard as you can without shrugging your shoulder. 4. Hold for 10-15 seconds. 5. Slowly release the tension and relax your muscles completely. Repeat 2-3 times. Complete this exercise 1 time  a day.  Exercise G: Shoulder flexion, isometric    1. Stand or sit about 4-6 inches (10-15 cm) away from a wall with your left / right side facing the wall. 2. Keep your left / right elbow straight and gently press the top of your fist against the wall. Increase the pressure slowly until you are pressing as hard as you can without shrugging your shoulder. 3. Hold for 10-15 seconds. 4. Slowly release the tension and relax your muscles completely. Repeat 2-3 times. Complete this exercise 1 time  a day.  Exercise H: Internal rotation    1. Sit in a stable chair without armrests, or stand. Secure an exercise band at your left / right  side, at elbow height. 2. Place a soft object, such as a folded towel or a small pillow, under your left / right upper arm so your elbow is a few inches (about 8 cm) away from your side. 3. Hold the end of the exercise band so the band stretches. 4. Keeping your elbow pressed against the soft object under your arm, move your forearm across your body toward your abdomen. Keep your body steady so the movement is only coming from your shoulder. 5. Hold for 10-15 seconds. 6. Slowly return to the starting position. Repeat 2-3 times. Complete this exercise 1 time  a day.  Exercise I: External rotation    1. Sit in a stable chair without armrests, or stand. 2. Secure an exercise band at  your left / right side, at elbow height. 3. Place a soft object, such as a folded towel or a small pillow, under your left / right upper arm so your elbow is a few inches (about 8 cm) away from your side. 4. Hold the end of the exercise band so the band stretches. 5. Keeping your elbow pressed against the soft object under your arm, move your forearm out, away from your abdomen. Keep your body steady so the movement is only coming from your shoulder. 6. Hold for 10-15 seconds. 7. Slowly return to the starting position. Repeat 2-3 times. Complete this exercise 1 time  a day. Exercise J: Shoulder extension  1. Sit in a stable chair without armrests, or stand. Secure an exercise band to a stable object in front of you so the band is at shoulder height. 2. Hold one end of the exercise band in each hand. Your palms should face each other. 3. Straighten your elbows and lift your hands up to shoulder height. 4. Step back, away from the secured end of the exercise band, until the band stretches. 5. Squeeze your shoulder blades together and pull your hands down to the sides of your thighs. Stop when your hands are straight down by your sides. Do not let your hands go behind your body. 6. Hold for 10-15 seconds. 7. Slowly  return to the starting position. Repeat 2-3 times. Complete this exercise 1 time  a day.  Exercise K: Shoulder extension, prone    1. Lie on your abdomen on a firm surface so your left / right arm hangs over the edge. 2. Hold a 5 lb weight in your hand so your palm faces in toward your body. Your arm should be straight. 3. Squeeze your shoulder blade down toward the middle of your back. 4. Slowly raise your arm behind you, up to the height of the surface that you are lying on. Keep your arm straight. 5. Hold for 10-15 seconds. 6. Slowly return to the starting position and relax your muscles. Repeat 2-3 times. Complete this exercise 1 time  a day.  Exercise L: Horizontal abduction, prone  1. Lie on your abdomen on a firm surface so your left / right arm hangs over the edge. 2. Hold a 5 lb weight in your hand so your palm faces toward your feet. Your arm should be straight. 3. Squeeze your shoulder blade down toward the middle of your back. 4. Bend your elbow so your hand moves up, until your elbow is bent to an "L" shape (90 degrees). With your elbow bent, slowly move your forearm forward and up. Raise your hand up to the height of the surface that you are lying on. ? Your upper arm should not move, and your elbow should stay bent. ? At the top of the movement, your palm should face the floor. 5. Hold for 10-15 seconds. 6. Slowly return to the starting position and relax your muscles. Repeat 2-3 times. Complete this exercise 1 time a day.  Exercise M: Horizontal abduction, standing  1. Sit on a stable chair, or stand. 2. Secure an exercise band to a stable object in front of you so the band is at shoulder height. 3. Hold one end of the exercise band in each hand. 4. Straighten your elbows and lift your hands straight in front of you, up to shoulder height. Your palms should face down, toward the floor. 5. Step back, away from the secured end of the  exercise band, until the band  stretches. 6. Move your arms out to your sides, and keep your arms straight. 7. Hold for 10-15 seconds. 8. Slowly return to the starting position. Repeat 2-3 times. Complete this exercise 1 time a day.  Exercise N: Scapular retraction and elevation  1. Sit on a stable chair, or stand. 2. Secure an exercise band to a stable object in front of you so the band is at shoulder height. 3. Hold one end of the exercise band in each hand. Your palms should face each other. 4. Sit in a stable chair without armrests, or stand. 5. Step back, away from the secured end of the exercise band, until the band stretches. 6. Squeeze your shoulder blades together and lift your hands over your head. Keep your elbows straight. 7. Hold for 10-15 seconds. 8. Slowly return to the starting position. Repeat 3-4 times. Complete this exercise 1-2 times a day.  This information is not intended to replace advice given to you by your health care provider. Make sure you discuss any questions you have with your health care provider. Document Released: 04/03/2005 Document Revised: 12/09/2015 Document Reviewed: 02/18/2015 Elsevier Interactive Patient Education  2017 [employer].
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
## 2016-12-05 DIAGNOSIS — H35351 Cystoid macular degeneration, right eye: Secondary | ICD-10-CM | POA: Diagnosis not present

## 2016-12-05 DIAGNOSIS — H3581 Retinal edema: Secondary | ICD-10-CM | POA: Diagnosis not present

## 2016-12-05 DIAGNOSIS — H34831 Tributary (branch) retinal vein occlusion, right eye, with macular edema: Secondary | ICD-10-CM | POA: Diagnosis not present

## 2017-01-24 ENCOUNTER — Other Ambulatory Visit: Payer: Self-pay | Admitting: Family Medicine

## 2017-01-24 DIAGNOSIS — I10 Essential (primary) hypertension: Secondary | ICD-10-CM

## 2017-02-15 ENCOUNTER — Ambulatory Visit (INDEPENDENT_AMBULATORY_CARE_PROVIDER_SITE_OTHER): Payer: BLUE CROSS/BLUE SHIELD | Admitting: Family Medicine

## 2017-02-15 ENCOUNTER — Encounter: Payer: Self-pay | Admitting: Family Medicine

## 2017-02-15 VITALS — BP 130/92 | HR 83 | Temp 98.2°F | Ht 70.0 in | Wt 243.1 lb

## 2017-02-15 DIAGNOSIS — J069 Acute upper respiratory infection, unspecified: Secondary | ICD-10-CM | POA: Diagnosis not present

## 2017-02-15 DIAGNOSIS — R0981 Nasal congestion: Secondary | ICD-10-CM

## 2017-02-15 MED ORDER — METHYLPREDNISOLONE 4 MG PO TBPK: ORAL_TABLET | ORAL | 0 refills | Status: DC

## 2017-02-15 MED ORDER — FLUTICASONE PROPIONATE 50 MCG/ACT NA SUSP: 2.0000 | Freq: Every day | NASAL | 6 refills | Status: DC

## 2017-02-15 NOTE — Progress Notes (Signed)
Chief Complaint  Patient presents with  . Nasal Congestion    Dale Hodges here for URI complaints.  Duration: 4 days  Associated symptoms: sinus congestion, sinus pain, rhinorrhea, itchy watery eyes, and cough Denies: ear pain, ear drainage, sore throat, shortness of breath, myalgia and fevers Treatment to date: Zicam menthol nasal spray  Sick contacts: Yes  ROS:  Const: Denies fevers HEENT: As noted in HPI Lungs: No SOB  Past Medical History:  Diagnosis Date  . Bipolar 1 disorder (HCC)   . Branch retinal vein occlusion    right eye  . Diverticulitis   . History of chicken pox   . Hypertension    Family History  Problem Relation Age of Onset  . Hypertension Mother   . Cancer Sister   . Diabetes Sister   . Hypertension Brother     BP (!) 130/92 (BP Location: Left Arm, Patient Position: Sitting, Cuff Size: Large)   Pulse 83   Temp 98.2 F (36.8 C) (Oral)   Ht 5\' 10"  (1.778 m)   Wt 243 lb 2 oz (110.3 kg)   SpO2 97%   BMI 34.88 kg/m  General: Awake, alert, appears stated age HEENT: AT, [place], ears patent b/l and TM's neg, nares patent w/o discharge, turbinates are enlarged bilaterally, pharynx pink and without exudates, MMM Neck: No masses or asymmetry Heart: RRR, no murmurs, no bruits Lungs: CTAB, no accessory muscle use Psych: Age appropriate judgment and insight, normal mood and affect  Upper respiratory tract infection, unspecified type - Plan: fluticasone (FLONASE) 50 MCG/ACT nasal spray, methylPREDNISolone (MEDROL DOSEPAK) 4 MG TBPK tablet  Nasal congestion - Plan: fluticasone (FLONASE) 50 MCG/ACT nasal spray, methylPREDNISolone (MEDROL DOSEPAK) 4 MG TBPK tablet  Orders as above. Keep checking blood pressures, if still elevated let us know or make appointment. Continue to push fluids, practice good hand hygiene, cover mouth when coughing. F/u prn. If starting to experience fevers, shaking, or shortness of breath, seek immediate care. Pt voiced  understanding and agreement to the plan.  Jilda Rocheicholas Paul [place], DO 02/15/17 2:04 PM

## 2017-02-15 NOTE — Patient Instructions (Addendum)
Use the Flonase or the steroid, not both at the same time.   Continue to push fluids, practice good hand hygiene, and cover your mouth if you cough.  If you start having fevers, shaking or shortness of breath, seek immediate care.  Flonase (fluticasone); nasal spray that is over the counter. 2 sprays each nostril, once daily. Aim towards the same side eye when you spray.  If your blood pressures continue to be high at home, let me know or return to clinic.   Let us know if you need anything.

## 2017-02-15 NOTE — Progress Notes (Signed)
Pre visit review using our clinic review tool, if applicable. No additional management support is needed unless otherwise documented below in the visit note. 

## 2017-02-27 DIAGNOSIS — H34831 Tributary (branch) retinal vein occlusion, right eye, with macular edema: Secondary | ICD-10-CM | POA: Diagnosis not present

## 2017-02-27 DIAGNOSIS — H35351 Cystoid macular degeneration, right eye: Secondary | ICD-10-CM | POA: Diagnosis not present

## 2017-03-05 ENCOUNTER — Other Ambulatory Visit: Payer: Self-pay | Admitting: Family Medicine

## 2017-03-05 DIAGNOSIS — I10 Essential (primary) hypertension: Secondary | ICD-10-CM

## 2017-05-10 ENCOUNTER — Ambulatory Visit: Payer: BLUE CROSS/BLUE SHIELD | Admitting: Family Medicine

## 2017-05-10 ENCOUNTER — Encounter: Payer: Self-pay | Admitting: Family Medicine

## 2017-05-10 VITALS — BP 110/72 | HR 70 | Temp 98.3°F | Ht 70.0 in | Wt 245.1 lb

## 2017-05-10 DIAGNOSIS — Z23 Encounter for immunization: Secondary | ICD-10-CM

## 2017-05-10 DIAGNOSIS — I10 Essential (primary) hypertension: Secondary | ICD-10-CM

## 2017-05-10 MED ORDER — METOPROLOL SUCCINATE ER 50 MG PO TB24: ORAL_TABLET | ORAL | 3 refills | Status: DC

## 2017-05-10 MED ORDER — LOSARTAN POTASSIUM 100 MG PO TABS: ORAL_TABLET | ORAL | 3 refills | Status: DC

## 2017-05-10 MED ORDER — AMLODIPINE BESYLATE 10 MG PO TABS: ORAL_TABLET | ORAL | 3 refills | Status: DC

## 2017-05-10 NOTE — Patient Instructions (Addendum)
Keep up the good work.   Keep checking your BP at home around 1 time per week. If it keeps lowering, we will taper down on your medicine.   Let us know if you need anything.

## 2017-05-10 NOTE — Progress Notes (Signed)
Pre visit review using our clinic review tool, if applicable. No additional management support is needed unless otherwise documented below in the visit note. 

## 2017-05-10 NOTE — Progress Notes (Signed)
Chief Complaint  Patient presents with  . Follow-up    Subjective Dale Hodges is a 47 y.o. male who presents for hypertension follow up. He does monitor home blood pressures. Blood pressures ranging from 120's/70-80's on average. He is compliant with medications- Norvasc 10 mg/d, Toprol XL 50 mg/d, Losartan 100 mg/d. Patient has these side effects of medication: none He is adhering to a healthy diet overall. He just turned to vegan. Current exercise: started exercising 2-3/week- walking, weights   Past Medical History:  Diagnosis Date  . Bipolar 1 disorder (HCC)   . Branch retinal vein occlusion    right eye  . Diverticulitis   . History of chicken pox   . Hypertension    Allergies Allergies  Allergen Reactions  . Codeine     Triggers manic episode    Review of Systems Cardiovascular: no chest pain Respiratory:  no shortness of breath  Exam BP 110/72 (BP Location: Left Arm, Patient Position: Sitting, Cuff Size: Normal)   Pulse 70   Temp 98.3 F (36.8 C) (Oral)   Ht 5\' 10"  (1.778 m)   Wt 245 lb 2 oz (111.2 kg)   SpO2 98%   BMI 35.17 kg/m  General:  well developed, well nourished, in no apparent distress Skin: warm, no pallor or diaphoresis Eyes: pupils equal and round, sclera anicteric without injection Heart: RRR, no bruits, no LE edema Lungs: clear to auscultation, no accessory muscle use Psych: well oriented with normal range of affect and appropriate judgment/insight  Essential hypertension - Plan: losartan (COZAAR) 100 MG tablet, metoprolol succinate (TOPROL-XL) 50 MG 24 hr tablet, amLODipine (NORVASC) 10 MG tablet  Need for influenza vaccination - Plan: Flu Vaccine QUAD 6+ mos PF IM (Fluarix Quad PF)  Orders as above. Ck BP at home 1x/week to make sure he is not dropping too low.  Counseled on diet and exercise. He is doing well. F/u in 6 mo for CPE.  The patient voiced understanding and agreement to the plan.  Jilda Rocheicholas Paul [place],  DO 05/10/17  3:20 PM

## 2017-05-29 DIAGNOSIS — L218 Other seborrheic dermatitis: Secondary | ICD-10-CM | POA: Diagnosis not present

## 2017-05-29 DIAGNOSIS — L738 Other specified follicular disorders: Secondary | ICD-10-CM | POA: Diagnosis not present

## 2017-05-29 DIAGNOSIS — L821 Other seborrheic keratosis: Secondary | ICD-10-CM | POA: Diagnosis not present

## 2017-06-29 DIAGNOSIS — H34831 Tributary (branch) retinal vein occlusion, right eye, with macular edema: Secondary | ICD-10-CM | POA: Diagnosis not present

## 2017-11-09 ENCOUNTER — Encounter: Payer: BLUE CROSS/BLUE SHIELD | Admitting: Family Medicine

## 2018-01-14 ENCOUNTER — Encounter: Payer: Self-pay | Admitting: Family Medicine

## 2018-01-14 ENCOUNTER — Ambulatory Visit (INDEPENDENT_AMBULATORY_CARE_PROVIDER_SITE_OTHER): Payer: BLUE CROSS/BLUE SHIELD | Admitting: Family Medicine

## 2018-01-14 VITALS — BP 118/70 | HR 84 | Temp 98.8°F | Ht 70.0 in | Wt 251.1 lb

## 2018-01-14 DIAGNOSIS — Z23 Encounter for immunization: Secondary | ICD-10-CM | POA: Diagnosis not present

## 2018-01-14 DIAGNOSIS — I10 Essential (primary) hypertension: Secondary | ICD-10-CM

## 2018-01-14 DIAGNOSIS — Z0000 Encounter for general adult medical examination without abnormal findings: Secondary | ICD-10-CM

## 2018-01-14 LAB — COMPREHENSIVE METABOLIC PANEL
ALT: 41 U/L (ref 0–53)
AST: 22 U/L (ref 0–37)
Albumin: 4.3 g/dL (ref 3.5–5.2)
Alkaline Phosphatase: 61 U/L (ref 39–117)
BUN: 17 mg/dL (ref 6–23)
CO2: 30 mEq/L (ref 19–32)
Calcium: 9.2 mg/dL (ref 8.4–10.5)
Chloride: 101 mEq/L (ref 96–112)
Creatinine, Ser: 0.94 mg/dL (ref 0.40–1.50)
GFR: 92.01 mL/min (ref >60.00)
Glucose, Bld: 113 mg/dL — ABNORMAL HIGH (ref 70–99)
Potassium: 3.8 mEq/L (ref 3.5–5.1)
Sodium: 141 mEq/L (ref 135–145)
Total Bilirubin: 0.5 mg/dL (ref 0.2–1.2)
Total Protein: 6.9 g/dL (ref 6.0–8.3)

## 2018-01-14 LAB — LIPID PANEL
Cholesterol: 227 mg/dL — ABNORMAL HIGH (ref 0–200)
HDL: 29.9 mg/dL — ABNORMAL LOW (ref >39.00)
NonHDL: 197.47
Total CHOL/HDL Ratio: 8
Triglycerides: 311 mg/dL — ABNORMAL HIGH (ref 0.0–149.0)
VLDL: 62.2 mg/dL — ABNORMAL HIGH (ref 0.0–40.0)

## 2018-01-14 LAB — LDL CHOLESTEROL, DIRECT: Direct LDL: 154 mg/dL

## 2018-01-14 MED ORDER — AMLODIPINE BESYLATE 10 MG PO TABS: ORAL_TABLET | ORAL | 3 refills | Status: DC

## 2018-01-14 MED ORDER — LOSARTAN POTASSIUM 100 MG PO TABS: ORAL_TABLET | ORAL | 3 refills | Status: DC

## 2018-01-14 NOTE — Progress Notes (Signed)
Pre visit review using our clinic review tool, if applicable. No additional management support is needed unless otherwise documented below in the visit note. 

## 2018-01-14 NOTE — Addendum Note (Signed)
Addended by: Scharlene Gloss B on: 01/14/2018 02:08 PM   Modules accepted: Orders

## 2018-01-14 NOTE — Patient Instructions (Signed)
Give [place] 2-3 business days to get the results of your labs back.   Keep up the good work with working out.  Keep the diet clean.  If your BP continues to come down, we will start weaning down on   Let us know if you need anything.

## 2018-01-14 NOTE — Progress Notes (Signed)
Chief Complaint  Patient presents with  . Annual Exam    Well Male Dale Hodges is here for a complete physical.   His last physical was >1 year ago.  Current diet: in general, a "better but not great" diet.   Current exercise: HIIT, lifting, swimming Weight trend: stable  Daytime fatigue? No. Seat belt? Yes.    Health maintenance Tetanus- Yes HIV- Yes  Past Medical History:  Diagnosis Date  . Bipolar 1 disorder (HCC)   . Branch retinal vein occlusion    right eye  . Diverticulitis   . History of chicken pox   . Hypertension      Past Surgical History:  Procedure Laterality Date  . ABDOMINAL SURGERY    . COLON RESECTION    . COLOSTOMY REVERSAL      Medications  Current Outpatient Medications on File Prior to Visit  Medication Sig Dispense Refill  . amLODipine (NORVASC) 10 MG tablet TAKE 1 TABLET(10 MG) BY MOUTH DAILY 90 tablet 3  . lithium 600 MG capsule Take 1 capsule (600 mg total) by mouth 2 (two) times daily with a meal. 180 capsule 1  . losartan (COZAAR) 100 MG tablet TAKE 1 TABLET(100 MG) BY MOUTH DAILY 90 tablet 3  . QUEtiapine (SEROQUEL) 50 MG tablet Take 50 mg by mouth at bedtime.    . metoprolol succinate (TOPROL-XL) 50 MG 24 hr tablet TAKE 1 TABLET BY MOUTH DAILY, TAKE WITH OR IMMEDIATELY FOLLOWING A MEAL (Patient not taking: Reported on 01/14/2018) 90 tablet 3   Allergies Allergies  Allergen Reactions  . Codeine     Triggers manic episode    Family History Family History  Problem Relation Age of Onset  . Hypertension Mother   . Cancer Sister   . Diabetes Sister   . Hypertension Brother     Review of Systems: Constitutional: no fevers or chills Eye:  no recent significant change in vision Ear/Nose/Mouth/Throat:  Ears:  no tinnitus or hearing loss Nose/Mouth/Throat:  no complaints of nasal congestion, no sore throat Cardiovascular:  no chest pain, no palpitations Respiratory:  no cough and no shortness of breath Gastrointestinal:   no abdominal pain, no change in bowel habits GU:  Male: negative for dysuria, frequency, and incontinence and negative for prostate symptoms Musculoskeletal/Extremities:  no pain, redness, or swelling of the joints Integumentary (Skin/Breast):  no abnormal skin lesions reported Neurologic:  no headaches, no numbness, tingling Endocrine: No unexpected weight changes Hematologic/Lymphatic:  no night sweats  Exam BP 118/70 (BP Location: Left Arm, Patient Position: Sitting, Cuff Size: Large)   Pulse 84   Temp 98.8 F (37.1 C) (Oral)   Ht 5\' 10"  (1.778 m)   Wt 251 lb 2 oz (113.9 kg)   SpO2 97%   BMI 36.03 kg/m  General:  well developed, well nourished, in no apparent distress Skin:  no significant moles, warts, or growths Head:  no masses, lesions, or tenderness Eyes:  pupils equal and round, sclera anicteric without injection Ears:  canals without lesions, TMs shiny without retraction, no obvious effusion, no erythema Nose:  nares patent, septum midline, mucosa normal Throat/Pharynx:  lips and gingiva without lesion; tongue and uvula midline; non-inflamed pharynx; no exudates or postnasal drainage Neck: neck supple without adenopathy, thyromegaly, or masses Lungs:  clear to auscultation, breath sounds equal bilaterally, no respiratory distress Cardio:  regular rate and rhythm, no bruits, no LE edema Abdomen:  abdomen soft, nontender; bowel sounds normal; no masses or organomegaly Genital (male): circumcised  penis, no lesions or discharge; testes present bilaterally without masses or tenderness Rectal: Deferred Musculoskeletal:  symmetrical muscle groups noted without atrophy or deformity Extremities:  no clubbing, cyanosis, or edema, no deformities, no skin discoloration Neuro:  gait normal; deep tendon reflexes normal and symmetric Psych: well oriented with normal range of affect and appropriate judgment/insight  Assessment and Plan  Well adult exam - Plan: Comprehensive metabolic  panel, Lipid panel  Essential hypertension - Plan: amLODipine (NORVASC) 10 MG tablet, losartan (COZAAR) 100 MG tablet   Well 47 y.o. male. Counseled on diet and exercise. Doing very well. Other orders as above. Follow up in 1 year pending the above workup. The patient voiced understanding and agreement to the plan.  Jilda Roche [place], DO 01/14/18 2:02 PM

## 2018-01-16 ENCOUNTER — Other Ambulatory Visit: Payer: Self-pay | Admitting: Family Medicine

## 2018-01-16 DIAGNOSIS — E782 Mixed hyperlipidemia: Secondary | ICD-10-CM

## 2018-01-21 ENCOUNTER — Other Ambulatory Visit (INDEPENDENT_AMBULATORY_CARE_PROVIDER_SITE_OTHER): Payer: BLUE CROSS/BLUE SHIELD

## 2018-01-21 DIAGNOSIS — E782 Mixed hyperlipidemia: Secondary | ICD-10-CM | POA: Diagnosis not present

## 2018-01-21 LAB — LIPID PANEL
Cholesterol: 199 mg/dL (ref 0–200)
HDL: 27.3 mg/dL — ABNORMAL LOW (ref >39.00)
NonHDL: 171.94
Total CHOL/HDL Ratio: 7
Triglycerides: 271 mg/dL — ABNORMAL HIGH (ref 0.0–149.0)
VLDL: 54.2 mg/dL — ABNORMAL HIGH (ref 0.0–40.0)

## 2018-01-21 LAB — LDL CHOLESTEROL, DIRECT: Direct LDL: 129 mg/dL

## 2018-01-21 LAB — HEMOGLOBIN A1C: Hgb A1c MFr Bld: 5.8 % (ref 4.6–6.5)

## 2018-01-22 ENCOUNTER — Encounter: Payer: Self-pay | Admitting: Family Medicine

## 2018-01-22 DIAGNOSIS — R7303 Prediabetes: Secondary | ICD-10-CM | POA: Insufficient documentation

## 2018-01-22 DIAGNOSIS — E782 Mixed hyperlipidemia: Secondary | ICD-10-CM | POA: Insufficient documentation

## 2018-01-23 ENCOUNTER — Other Ambulatory Visit: Payer: Self-pay | Admitting: Family Medicine

## 2018-01-23 MED ORDER — ATORVASTATIN CALCIUM 40 MG PO TABS: 40.0000 mg | ORAL_TABLET | Freq: Every day | ORAL | 3 refills | Status: DC

## 2018-07-15 ENCOUNTER — Ambulatory Visit: Payer: BLUE CROSS/BLUE SHIELD | Admitting: Family Medicine

## 2018-08-13 ENCOUNTER — Ambulatory Visit (INDEPENDENT_AMBULATORY_CARE_PROVIDER_SITE_OTHER): Payer: BLUE CROSS/BLUE SHIELD | Admitting: Family Medicine

## 2018-08-13 ENCOUNTER — Other Ambulatory Visit: Payer: Self-pay

## 2018-08-13 ENCOUNTER — Encounter: Payer: Self-pay | Admitting: Family Medicine

## 2018-08-13 DIAGNOSIS — R0683 Snoring: Secondary | ICD-10-CM | POA: Diagnosis not present

## 2018-08-13 DIAGNOSIS — I10 Essential (primary) hypertension: Secondary | ICD-10-CM

## 2018-08-13 DIAGNOSIS — E782 Mixed hyperlipidemia: Secondary | ICD-10-CM | POA: Diagnosis not present

## 2018-08-13 NOTE — Progress Notes (Addendum)
Chief Complaint  Patient presents with  . Follow-up    6 month followup    Subjective Dale Hodges is a 48 y.o. male who presents for hypertension follow up. Due to COVID-19 pandemic, we are interacting via web portal for an electronic face-to-face visit. I verified patient's ID using 2 identifiers. Patient agreed to proceed with visit via this method. Patient is at home, I am at office. Patient and I are present for visit.  He does monitor home blood pressures. Blood pressures ranging from 120's/80's on average. He is compliant with medications- Norvasc 10 mg/d, losartan 100 mg/d. Patient has these side effects of medication: none He is adhering to a healthy diet overall. Current exercise: walking  Hyperlipidemia Patient presents for dyslipidemia follow up. Currently being treated with atorvastatin 40 mg/d and compliance with treatment thus far has been good. He denies myalgias. He is adhering to a healthy. Exercise: walking The patient is not known to have coexisting coronary artery disease.  He does snore at night and have daytime fatigue. Unknown if he stops breathing at night, but does suspect he may have sleep apnea. Has never had a sleep study.     Past Medical History:  Diagnosis Date  . Bipolar 1 disorder (HCC)   . Branch retinal vein occlusion    right eye  . Diverticulitis   . History of chicken pox   . Hypertension   . Mixed hyperlipidemia   . Prediabetes     Review of Systems Cardiovascular: no chest pain Respiratory:  no shortness of breath  Exam No conversational dyspnea Age appropriate judgment and insight Nml affect and mood  No diagnosis found.  Orders as above. Counseled on diet and exercise. F/u in 6 mo or prn. The patient voiced understanding and agreement to the plan.  Jilda Roche [place], DO 08/13/18  9:28 AM

## 2018-09-06 ENCOUNTER — Ambulatory Visit (INDEPENDENT_AMBULATORY_CARE_PROVIDER_SITE_OTHER): Payer: BLUE CROSS/BLUE SHIELD | Admitting: Internal Medicine

## 2018-09-06 ENCOUNTER — Other Ambulatory Visit: Payer: Self-pay

## 2018-09-06 ENCOUNTER — Encounter: Payer: Self-pay | Admitting: Internal Medicine

## 2018-09-06 VITALS — BP 128/78 | HR 84 | Temp 98.5°F | Ht 70.0 in | Wt 256.8 lb

## 2018-09-06 DIAGNOSIS — R0683 Snoring: Secondary | ICD-10-CM | POA: Diagnosis not present

## 2018-09-06 DIAGNOSIS — G4733 Obstructive sleep apnea (adult) (pediatric): Secondary | ICD-10-CM | POA: Diagnosis not present

## 2018-09-06 DIAGNOSIS — I10 Essential (primary) hypertension: Secondary | ICD-10-CM

## 2018-09-06 NOTE — Patient Instructions (Signed)
Order- please schedule unattended home sleep test  Dx OSA  Please call us about 2 weeks after your sleep test for results and recommendations. If appropriate, we may be able to start treatment before we see you next.

## 2018-09-06 NOTE — Progress Notes (Signed)
09/06/2018- [age] yoM former smoker for sleep evaluation. referred by Dr. Carmelia RollerWendling (PCP) for OSA evaluation, pt states he's gained weight, wakes up 3-4 times/night. Medical problem list includes HBP, Hyperlipidemia, Prediabetes Body weight today  256 lbs Epworth score 14 Wife tells him of witnessed apneas and loud snoring. Sleep is fragmented by frequent wakings, frequent nocturia. Severe fatigue, weight up.   No sleep meds. Daily large coffee and energy drink.  Brother on CPAP. Father + OSA, died pancreatic CA. Works daytime job as Psychologist, occupationalbanker.  Prior to Admission medications   Medication Sig Start Date End Date Taking? Authorizing Provider  amLODipine (NORVASC) 10 MG tablet TAKE 1 TABLET(10 MG) BY MOUTH DAILY 01/14/18  Yes Wendling, Jilda RocheNicholas Paul, DO  atorvastatin (LIPITOR) 40 MG tablet Take 1 tablet (40 mg total) by mouth daily. 01/23/18  Yes Sharlene DoryWendling, Nicholas Paul, DO  lithium 600 MG capsule Take 1 capsule (600 mg total) by mouth 2 (two) times daily with a meal. 05/12/16  Yes Wendling, Jilda RocheNicholas Paul, DO  losartan (COZAAR) 100 MG tablet TAKE 1 TABLET(100 MG) BY MOUTH DAILY 01/14/18  Yes Sharlene DoryWendling, Nicholas Paul, DO  QUEtiapine (SEROQUEL) 50 MG tablet Take 50 mg by mouth at bedtime.   Yes [provider]   Past Medical History:  Diagnosis Date  . Bipolar 1 disorder (HCC)   . Branch retinal vein occlusion    right eye  . Diverticulitis   . History of chicken pox   . Hypertension   . Mixed hyperlipidemia   . Prediabetes    Past Surgical History:  Procedure Laterality Date  . ABDOMINAL SURGERY    . COLON RESECTION    . COLOSTOMY REVERSAL     Family History  Problem Relation Age of Onset  . Hypertension Mother   . Cancer Sister   . Diabetes Sister   . Hypertension Brother    Social History   Socioeconomic History  . Marital status: Married    Spouse name: Not on file  . Number of children: Not on file  . Years of education: Not on file  . Highest education level: Not on file   Occupational History  . Not on file  Social Needs  . Financial resource strain: Not on file  . Food insecurity:    Worry: Not on file    Inability: Not on file  . Transportation needs:    Medical: Not on file    Non-medical: Not on file  Tobacco Use  . Smoking status: Former Smoker    Types: Cigarettes    Last attempt to quit: 09/06/1998    Years since quitting: 20.0  . Smokeless tobacco: Never Used  . Tobacco comment: states he was a social smoker  Substance and Sexual Activity  . Alcohol use: Yes    Comment: occasional  . Drug use: No  . Sexual activity: Not on file  Lifestyle  . Physical activity:    Days per week: Not on file    Minutes per session: Not on file  . Stress: Not on file  Relationships  . Social connections:    Talks on phone: Not on file    Gets together: Not on file    Attends religious service: Not on file    Active member of club or organization: Not on file    Attends meetings of clubs or organizations: Not on file    Relationship status: Not on file  . Intimate partner violence:    Fear of current or ex partner:  Not on file    Emotionally abused: Not on file    Physically abused: Not on file    Forced sexual activity: Not on file  Other Topics Concern  . Not on file  Social History Narrative  . Not on file   ROS-see HPI   + = positive Constitutional:    weight loss, night sweats, fevers, chills, +fatigue, lassitude. HEENT:   + headaches, difficulty swallowing, tooth/dental problems, sore throat,       sneezing, itching, ear ache, nasal congestion, post nasal drip, snoring CV:    chest pain, orthopnea, PND, +swelling in lower extremities, anasarca,                                  dizziness, palpitations Resp:   shortness of breath with exertion or at rest.                productive cough,   non-productive cough, coughing up of blood.              change in color of mucus.  wheezing.   Skin:    rash or lesions. GI:  No-   heartburn,  indigestion, abdominal pain, nausea, vomiting, diarrhea,                 change in bowel habits, loss of appetite GU: dysuria, change in color of urine, no urgency or frequency.   flank pain. MS:   joint pain, stiffness, decreased range of motion, back pain. Neuro-     nothing unusual Psych:  change in mood or affect.  depression or anxiety.   memory loss.  OBJ- Physical Exam General- Alert, Oriented, Affect-appropriate, Distress- none acute, + obese Skin- rash-none, lesions- none, excoriation- none Lymphadenopathy- none Head- atraumatic            Eyes- Gross vision intact, PERRLA, conjunctivae and secretions clear            Ears- Hearing, canals-normal            Nose- Clear, no-Septal dev, mucus, polyps, erosion, perforation             Throat- Mallampati III , mucosa clear , drainage- none, tonsils- atrophic Neck- flexible , trachea midline, no stridor , thyroid nl, carotid no bruit Chest - symmetrical excursion , unlabored           Heart/CV- RRR , no murmur , no gallop  , no rub, nl s1 s2                           - JVD- none , edema- none, stasis changes- none, varices- none           Lung- clear to P&A, wheeze- none, cough- none , dullness-none, rub- none           Chest wall-  Abd-  Br/ Gen/ Rectal- Not done, not indicated Extrem- cyanosis- none, clubbing, none, atrophy- none, strength- nl Neuro- grossly intact to observation

## 2018-09-09 DIAGNOSIS — G4733 Obstructive sleep apnea (adult) (pediatric): Secondary | ICD-10-CM | POA: Insufficient documentation

## 2018-09-09 DIAGNOSIS — R0683 Snoring: Secondary | ICD-10-CM | POA: Insufficient documentation

## 2018-09-09 NOTE — Assessment & Plan Note (Signed)
Probable medically significant OSA. Appropriate discussions including basic sleep hygiene, driving responsibility, physiology/ medical concerns/ testing and treatment.  Plan- schedule sleep study. Probably a candidate for CPAP, depending on results.

## 2018-09-09 NOTE — Assessment & Plan Note (Signed)
Discussed interaction between OSA and HPB with cardiovascular consequences potentially.

## 2018-09-16 ENCOUNTER — Other Ambulatory Visit: Payer: Self-pay

## 2018-09-16 ENCOUNTER — Ambulatory Visit: Payer: BLUE CROSS/BLUE SHIELD

## 2018-09-16 DIAGNOSIS — G4733 Obstructive sleep apnea (adult) (pediatric): Secondary | ICD-10-CM

## 2018-09-17 DIAGNOSIS — G4733 Obstructive sleep apnea (adult) (pediatric): Secondary | ICD-10-CM | POA: Diagnosis not present

## 2018-09-18 DIAGNOSIS — G4733 Obstructive sleep apnea (adult) (pediatric): Secondary | ICD-10-CM | POA: Diagnosis not present

## 2018-10-01 ENCOUNTER — Telehealth: Payer: Self-pay | Admitting: Internal Medicine

## 2018-10-01 DIAGNOSIS — G4733 Obstructive sleep apnea (adult) (pediatric): Secondary | ICD-10-CM

## 2018-10-01 NOTE — Telephone Encounter (Signed)
Sleep study showed severe obstructive sleep apnea, averaging 82 apneas per hour, with drops in blood oxygen level. I recommend we start CPAP as discussed at office visit.  Order- new DME, new CPAP auto 5-20, mask of choice, humidifier, supplies, AirView Please confirm that he has a return ov in 31-90 days, per insurance regs.

## 2018-10-01 NOTE — Telephone Encounter (Signed)
Called and spoke with Patient.  Patient requested home sleep results from 09/16/18.  Sleep study in media tab. LOV 09/06/18, with CY   Will route Dr Annamaria Boots to advise on sleep results  Allergies  Allergen Reactions  . Codeine     Triggers manic episode   Current Outpatient Medications on File Prior to Visit  Medication Sig Dispense Refill  . amLODipine (NORVASC) 10 MG tablet TAKE 1 TABLET(10 MG) BY MOUTH DAILY 90 tablet 3  . atorvastatin (LIPITOR) 40 MG tablet Take 1 tablet (40 mg total) by mouth daily. 90 tablet 3  . lithium 600 MG capsule Take 1 capsule (600 mg total) by mouth 2 (two) times daily with a meal. 180 capsule 1  . losartan (COZAAR) 100 MG tablet TAKE 1 TABLET(100 MG) BY MOUTH DAILY 90 tablet 3  . QUEtiapine (SEROQUEL) 50 MG tablet Take 50 mg by mouth at bedtime.     No current facility-administered medications on file prior to visit.

## 2018-10-02 NOTE — Telephone Encounter (Signed)
Called and spoke with Patient.  Sleep study results from Dr Annamaria Boots given.  Understanding stated.  Patient scheduled for cpap follow up 11/19/18, with Geraldo Pitter, NP.  DME order for new cpap placed.  Nothing further at this time.

## 2018-10-17 DIAGNOSIS — G4733 Obstructive sleep apnea (adult) (pediatric): Secondary | ICD-10-CM | POA: Diagnosis not present

## 2018-10-31 IMAGING — CT CT HEAD W/O CM
3 series · 15 of 47 positions shown, 18 images · non-contrast
Comparison: 10/08/2008

CLINICAL DATA: Headache for several days

EXAM:
CT HEAD WITHOUT CONTRAST
TECHNIQUE: Contiguous axial images were obtained from the base of the skull
through the vertex without intravenous contrast.

[Series 2: head wo · axial · 0.48mm/px · z∈[+793,+918]mm · 9 of 31 slices shown, 12 images]
[im 3/31  brain]
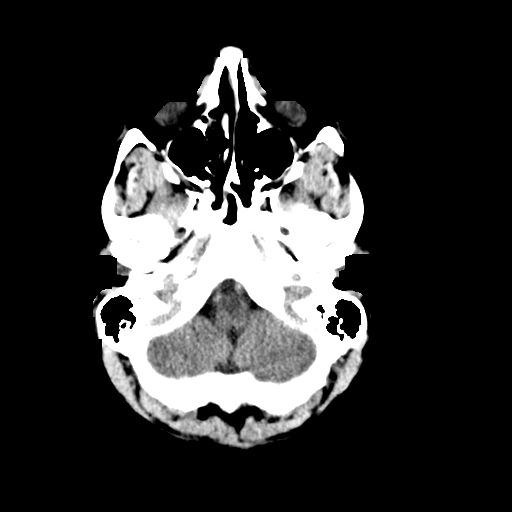
[im 3/31  bone]
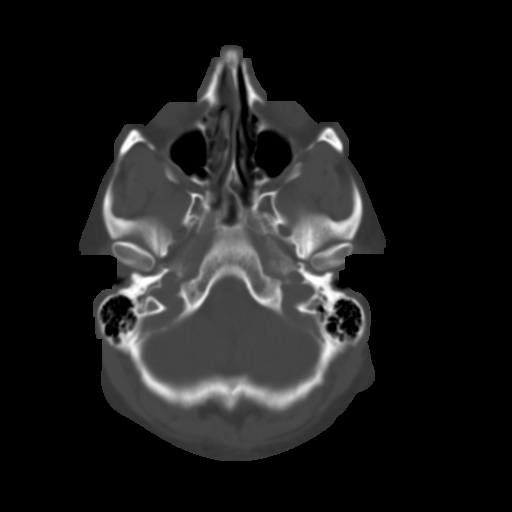
[im 6/31  brain]
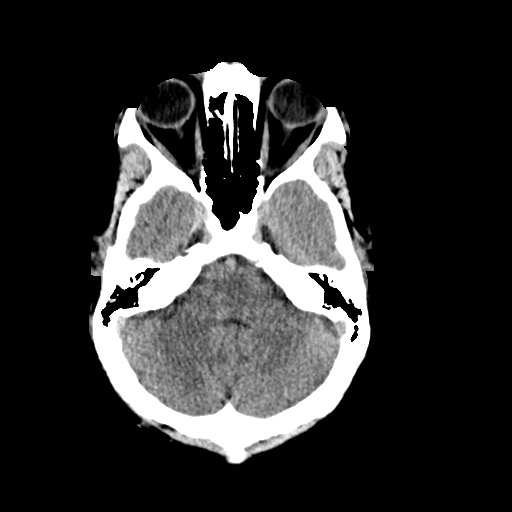
[im 9/31  brain]
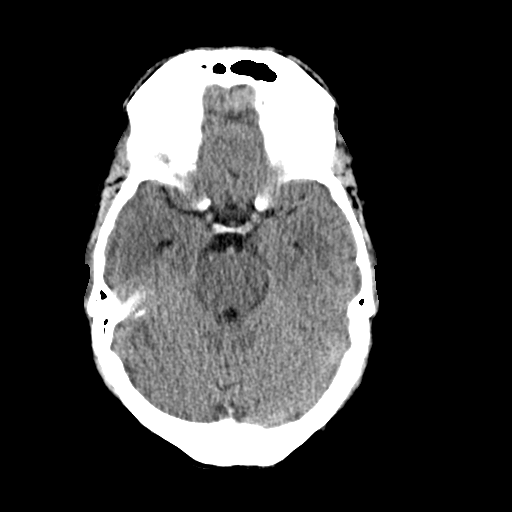
[im 12/31  brain]
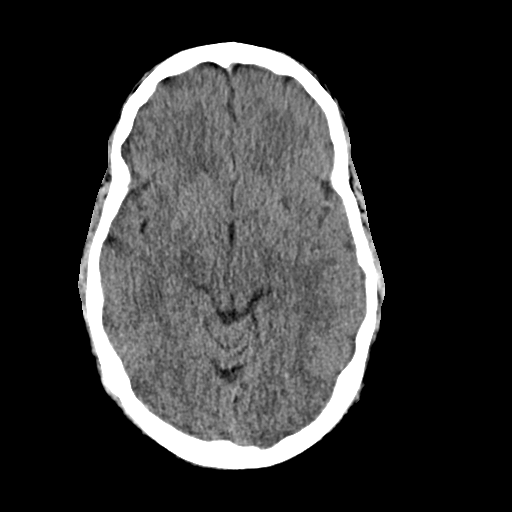
[im 16/31  brain]
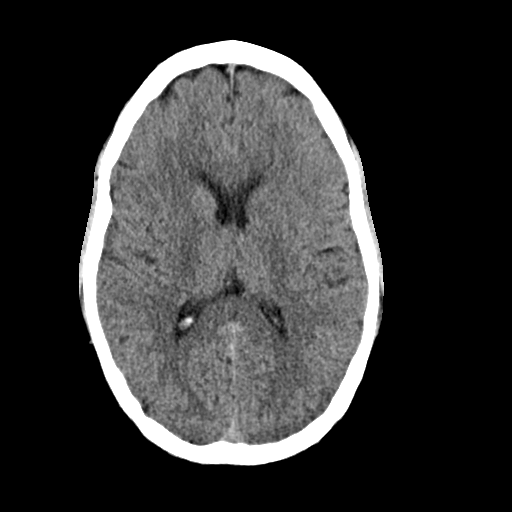
[im 16/31  bone]
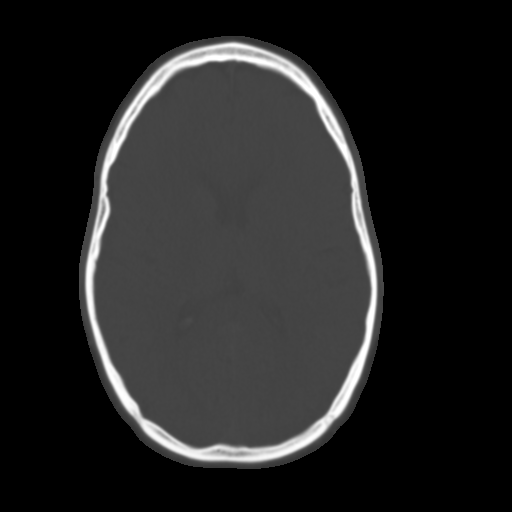
[im 19/31  brain]
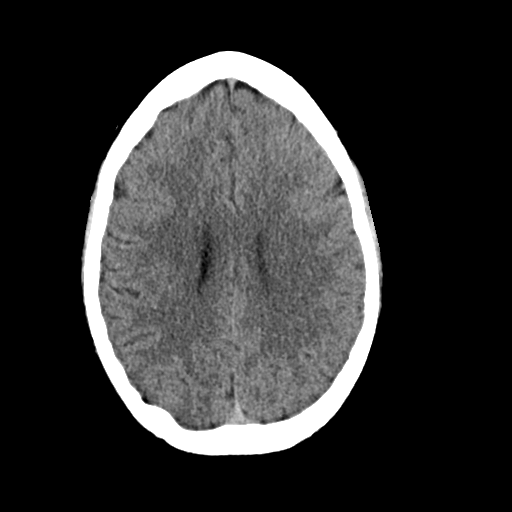
[im 22/31  brain]
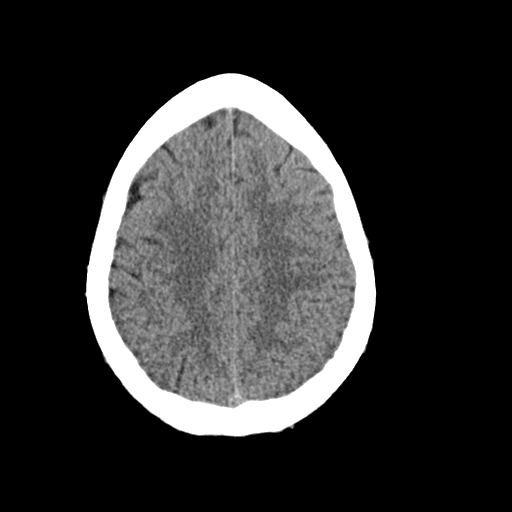
[im 25/31  brain]
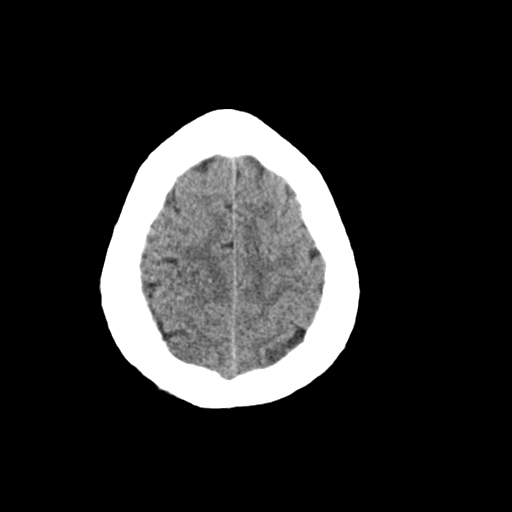
[im 28/31  brain]
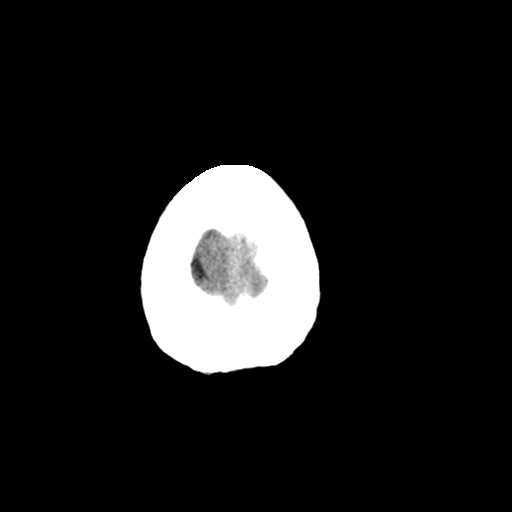
[im 28/31  bone]
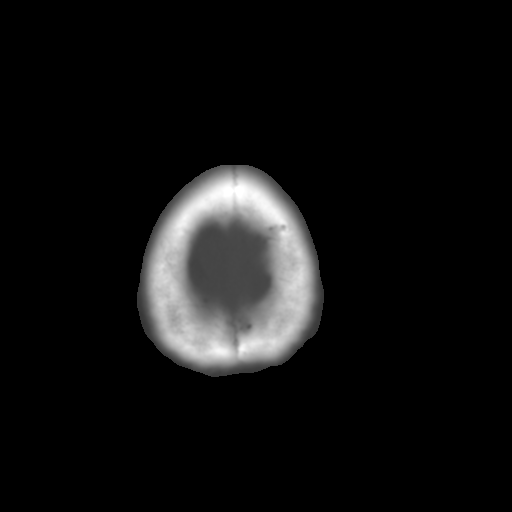

[Series 4: coronal soft · coronal · 0.30mm/px · 3 of 79 slices shown]
[im 27/79  brain]
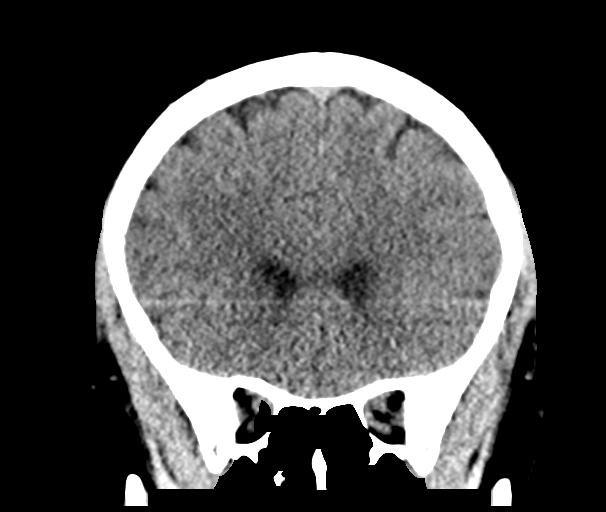
[im 35/79  brain]
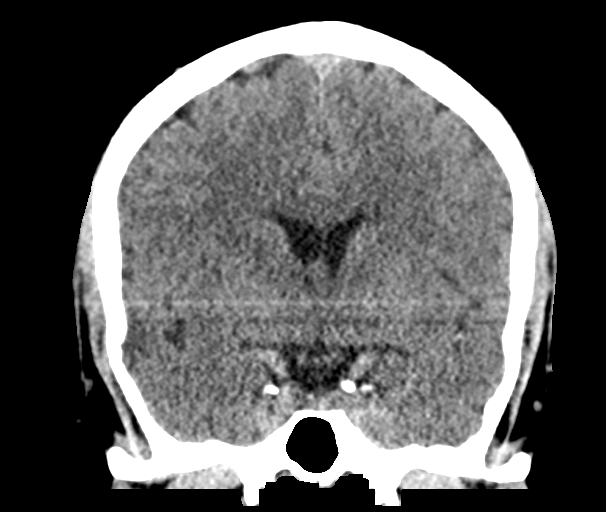
[im 44/79  brain]
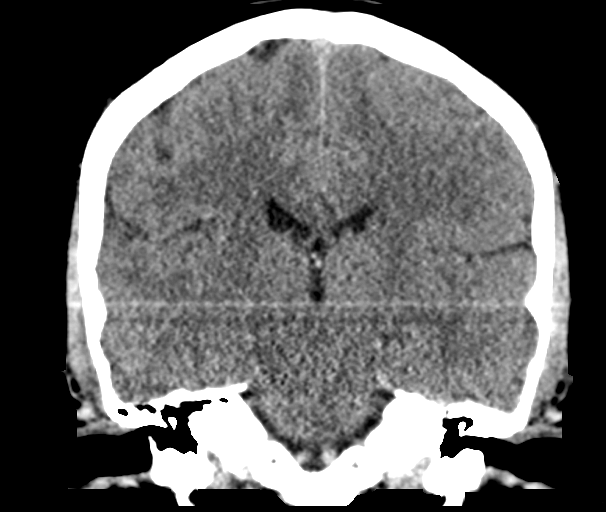

[Series 5: sag soft · sagittal · 0.30mm/px · 3 of 58 slices shown]
[im 20/58  brain]
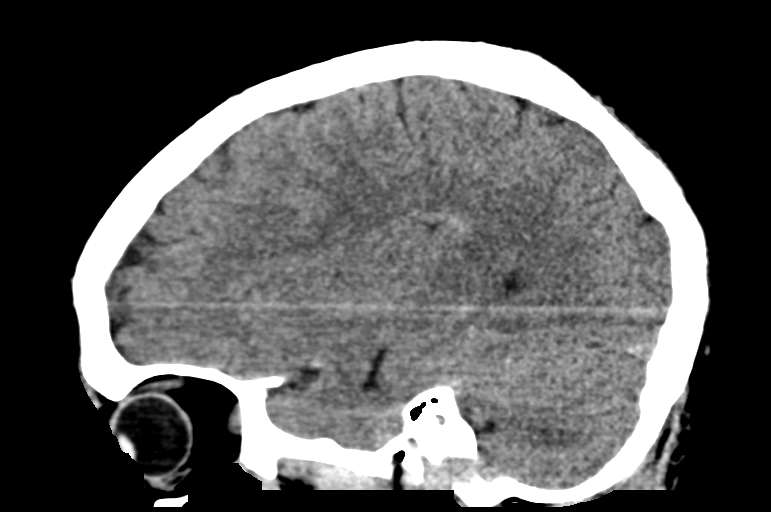
[im 29/58  brain]
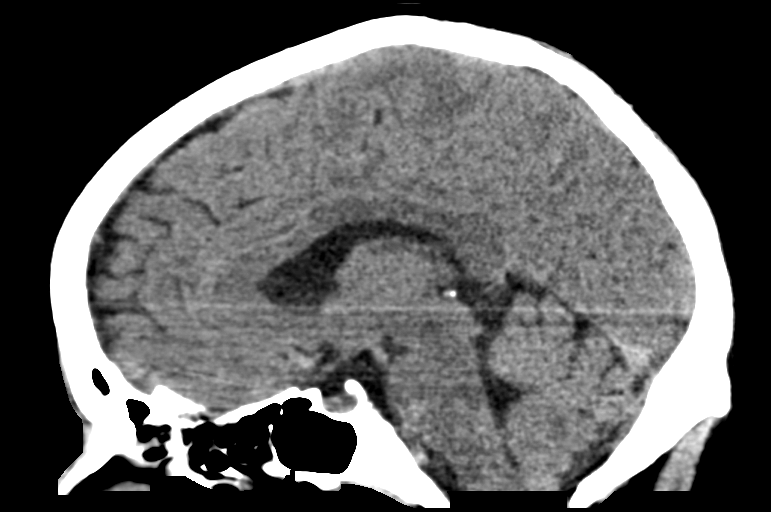
[im 39/58  brain]
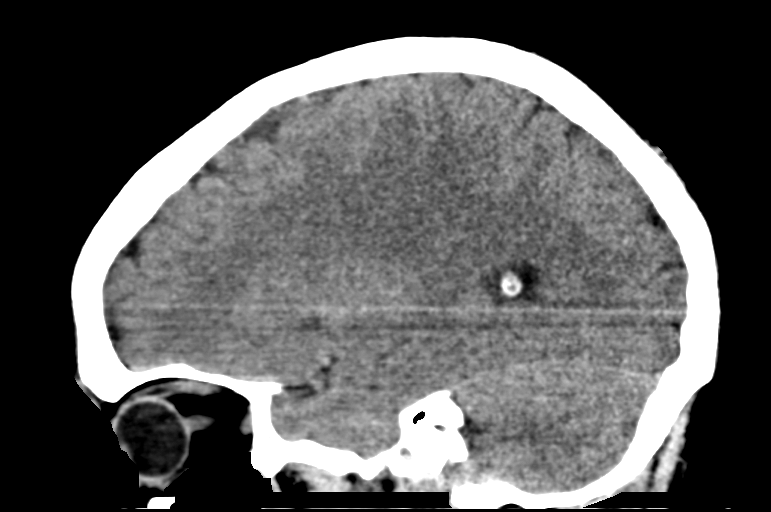

[15 of 47 positions shown; findings below may reference images not displayed]

FINDINGS: Brain: No evidence of acute infarction, hemorrhage, hydrocephalus,
extra-axial collection or mass lesion/mass effect.

Vascular: No hyperdense vessel or unexpected calcification.

Skull: Normal. Negative for fracture or focal lesion.

Sinuses/Orbits: Mucosal thickening within the ethmoid sinuses. No
acute orbital abnormality peer

Other: None
IMPRESSION: No CT evidence for acute intracranial abnormality

## 2018-11-11 ENCOUNTER — Other Ambulatory Visit: Payer: Self-pay

## 2018-11-11 ENCOUNTER — Ambulatory Visit: Payer: Self-pay | Admitting: *Deleted

## 2018-11-11 ENCOUNTER — Encounter: Payer: Self-pay | Admitting: Family Medicine

## 2018-11-11 ENCOUNTER — Ambulatory Visit (INDEPENDENT_AMBULATORY_CARE_PROVIDER_SITE_OTHER): Payer: BC Managed Care – PPO | Admitting: Family Medicine

## 2018-11-11 DIAGNOSIS — Z7189 Other specified counseling: Secondary | ICD-10-CM

## 2018-11-11 DIAGNOSIS — R509 Fever, unspecified: Secondary | ICD-10-CM | POA: Diagnosis not present

## 2018-11-11 MED ORDER — ONDANSETRON HCL 4 MG PO TABS: 4.0000 mg | ORAL_TABLET | Freq: Three times a day (TID) | ORAL | 0 refills | Status: DC | PRN

## 2018-11-11 MED ORDER — BENZONATATE 100 MG PO CAPS: 100.0000 mg | ORAL_CAPSULE | Freq: Three times a day (TID) | ORAL | 0 refills | Status: DC | PRN

## 2018-11-11 NOTE — Progress Notes (Signed)
CC: URI complaints  Dale Hodges here for URI complaints. Due to COVID-19 pandemic, we are interacting via web portal for an electronic face-to-face visit. I verified patient's ID using 2 identifiers. Patient agreed to proceed with visit via this method. Patient is at home, I am at office. Patient and I are present for visit.    Duration: 2 day  Associated symptoms: Fever (100.9), fatigue, dyspnea, cough, swollen glands Denies: sinus congestion, sinus pain, rhinorrhea, itchy watery eyes, ear pain, ear drainage, sore throat, wheezing, shortness of breath and myalgia Treatment to date: none Sick contacts: Yes  ROS:  Const: Denies fevers HEENT: As noted in HPI Lungs: No SOB  Past Medical History:  Diagnosis Date  . Bipolar 1 disorder ([place])   . Branch retinal vein occlusion    right eye  . Diverticulitis   . History of chicken pox   . Hypertension   . Mixed hyperlipidemia   . Prediabetes    Exam No conversational dyspnea Age appropriate judgment and insight Nml affect and mood  Advice Given About Covid-19 Virus Infection - Plan: ondansetron (ZOFRAN) 4 MG tablet, benzonatate (TESSALON) 100 MG capsule  Fever, unspecified fever cause - Plan: Tylenol prn.   Orders as above. Continue to push fluids, practice good hand hygiene, cover mouth when coughing. F/u prn. If starting to experience inability to keep down fluids or shortness of breath, seek immediate care. Pt voiced understanding and agreement to the plan.  [place], DO 11/11/18 3:37 PM

## 2018-11-11 NOTE — Telephone Encounter (Signed)
  Call to office for appointment to review symptoms and make appointment to follow. Reason for Disposition . MILD difficulty breathing (e.g., minimal/no SOB at rest, SOB with walking, pulse <100)  Answer Assessment - Initial Assessment Questions 1. COVID-19 DIAGNOSIS: "Who made your Coronavirus (COVID-19) diagnosis?" "Was it confirmed by a positive lab test?" If not diagnosed by a HCP, ask "Are there lots of cases (community spread) where you live?" (See public health department website, if unsure)     Patient is having symptoms 2. ONSET: "When did the COVID-19 symptoms start?"      Yesterday afternoon 3. WORST SYMPTOM: "What is your worst symptom?" (e.g., cough, fever, shortness of breath, muscle aches)     Flu like symptoms- head pressure- SOB with talking 4. COUGH: "Do you have a cough?" If so, ask: "How bad is the cough?"       Slight cough-dry 5. FEVER: "Do you have a fever?" If so, ask: "What is your temperature, how was it measured, and when did it start?"     Yes- 100.3- 10:15, oral 6. RESPIRATORY STATUS: "Describe your breathing?" (e.g., shortness of breath, wheezing, unable to speak)      Winded with talking,drained 7. BETTER-SAME-WORSE: "Are you getting better, staying the same or getting worse compared to yesterday?"  If getting worse, ask, "In what way?"     Worse- increase in symptoms 8. HIGH RISK DISEASE: "Do you have any chronic medical problems?" (e.g., asthma, heart or lung disease, weak immune system, etc.)     High blood pressure, sleep apnea 9. PREGNANCY: "Is there any chance you are pregnant?" "When was your last menstrual period?"     n/a 10. OTHER SYMPTOMS: "Do you have any other symptoms?"  (e.g., chills, fatigue, headache, loss of smell or taste, muscle pain, sore throat)       Fatigue, flu like symptoms  Protocols used: CORONAVIRUS (COVID-19) DIAGNOSED OR SUSPECTED-A-AH

## 2018-11-11 NOTE — Telephone Encounter (Signed)
Appt scheduled

## 2018-11-12 DIAGNOSIS — R51 Headache: Secondary | ICD-10-CM | POA: Diagnosis not present

## 2018-11-12 DIAGNOSIS — Z1159 Encounter for screening for other viral diseases: Secondary | ICD-10-CM | POA: Diagnosis not present

## 2018-11-12 DIAGNOSIS — R05 Cough: Secondary | ICD-10-CM | POA: Diagnosis not present

## 2018-11-17 DIAGNOSIS — G4733 Obstructive sleep apnea (adult) (pediatric): Secondary | ICD-10-CM | POA: Diagnosis not present

## 2018-11-18 NOTE — Progress Notes (Signed)
Virtual Visit via Telephone Note  I connected with Dale Hodges on 11/19/18 at  9:30 AM EDT by telephone and verified that I am speaking with the correct person using two identifiers.  Location: Patient: Home Provider: Office   I discussed the limitations, risks, security and privacy concerns of performing an evaluation and management service by telephone and the availability of in person appointments. I also discussed with the patient that there may be a patient responsible charge related to this service. The patient expressed understanding and agreed to proceed.   History of Present Illness: 48 year old male, former smoker. PMH significant for OSA, snoring, hypertension, prediabetes. Patient of Dr. Annamaria Boots, see for initial consult on 09/06/18.  HST on 09/17/18 showed severe obstructive sleep apnea with AHI 82/hr. recommended CPAP therapy with follow-up.   11/19/2018 Patient contacted today for televisit/OSA follow-up. He is doing well, no acute complaints. He has been using CPAP for 1 month and noticed that he started feeling a lot better after 3 days of use. He wears a full face mask full face. No issues with mask pressure or fit.   Airview download: Usage 30/30 days; 100% >4 hours Average use 6 hours 32 mins Pressure 5-20 cm H20 (18) Leaks 6.0 L/min AHI 5.4   Observations/Objective:  - No shortness of breath, wheezing or cough  Assessment and Plan:  OSA - 100% compliant with CPAP use and reports benefit - Pressure 5-20; AHI 5.4 - Adjust pressure to 7-22 cm h20 - Aim to wear CPAP for 4-6 hours each night  - Advised patient not to drive if experiencing excessive daytime fatigue or somnolence  Follow Up Instructions:  -6 months with Dr. Annamaria Boots   I discussed the assessment and treatment plan with the patient. The patient was provided an opportunity to ask questions and all were answered. The patient agreed with the plan and demonstrated an understanding of the  instructions.   The patient was advised to call back or seek an in-person evaluation if the symptoms worsen or if the condition fails to improve as anticipated.  I provided 15 minutes of non-face-to-face time during this encounter.   Martyn Ehrich, NP

## 2018-11-19 ENCOUNTER — Other Ambulatory Visit: Payer: Self-pay

## 2018-11-19 ENCOUNTER — Ambulatory Visit: Payer: BLUE CROSS/BLUE SHIELD | Admitting: Primary Care

## 2018-11-19 ENCOUNTER — Ambulatory Visit (INDEPENDENT_AMBULATORY_CARE_PROVIDER_SITE_OTHER): Payer: BC Managed Care – PPO | Admitting: Primary Care

## 2018-11-19 ENCOUNTER — Encounter: Payer: Self-pay | Admitting: Primary Care

## 2018-11-19 DIAGNOSIS — G4733 Obstructive sleep apnea (adult) (pediatric): Secondary | ICD-10-CM | POA: Diagnosis not present

## 2018-11-19 DIAGNOSIS — Z9989 Dependence on other enabling machines and devices: Secondary | ICD-10-CM

## 2018-11-19 NOTE — Addendum Note (Signed)
Addended by: Joella Prince on: 11/19/2018 09:53 AM   Modules accepted: Orders

## 2018-11-19 NOTE — Patient Instructions (Signed)
Change pressure setting 7-22cm H20   Download in 1 month   Make sure to wear CPAP every night for 4-6 hours or more Do no drive if experiencing excessive daytime fatigue or somnolence  Follow up in 6 months with Dr. Annamaria Boots    CPAP and BPAP Information CPAP and BPAP are methods of helping a person breathe with the use of air pressure. CPAP stands for "continuous positive airway pressure." BPAP stands for "bi-level positive airway pressure." In both methods, air is blown through your nose or mouth and into your air passages to help you breathe well. CPAP and BPAP use different amounts of pressure to blow air. With CPAP, the amount of pressure stays the same while you breathe in and out. With BPAP, the amount of pressure is increased when you breathe in (inhale) so that you can take larger breaths. Your health care provider will recommend whether CPAP or BPAP would be more helpful for you. Why are CPAP and BPAP treatments used? CPAP or BPAP can be helpful if you have:  Sleep apnea.  Chronic obstructive pulmonary disease (COPD).  Heart failure.  Medical conditions that weaken the muscles of the chest including muscular dystrophy, or neurological diseases such as amyotrophic lateral sclerosis (ALS).  Other problems that cause breathing to be weak, abnormal, or difficult. CPAP is most commonly used for obstructive sleep apnea (OSA) to keep the airways from collapsing when the muscles relax during sleep. How is CPAP or BPAP administered? Both CPAP and BPAP are provided by a small machine with a flexible plastic tube that attaches to a plastic mask. You wear the mask. Air is blown through the mask into your nose or mouth. The amount of pressure that is used to blow the air can be adjusted on the machine. Your health care provider will determine the pressure setting that should be used based on your individual needs. When should CPAP or BPAP be used? In most cases, the mask only needs to be worn  during sleep. Generally, the mask needs to be worn throughout the night and during any daytime naps. People with certain medical conditions may also need to wear the mask at other times when they are awake. Follow instructions from your health care provider about when to use the machine. What are some tips for using the mask?   Because the mask needs to be snug, some people feel trapped or closed-in (claustrophobic) when first using the mask. If you feel this way, you may need to get used to the mask. One way to do this is by holding the mask loosely over your nose or mouth and then gradually applying the mask more snugly. You can also gradually increase the amount of time that you use the mask.  Masks are available in various types and sizes. Some fit over your mouth and nose while others fit over just your nose. If your mask does not fit well, talk with your health care provider about getting a different one.  If you are using a mask that fits over your nose and you tend to breathe through your mouth, a chin strap may be applied to help keep your mouth closed.  The CPAP and BPAP machines have alarms that may sound if the mask comes off or develops a leak.  If you have trouble with the mask, it is very important that you talk with your health care provider about finding a way to make the mask easier to tolerate. Do not stop  using the mask. Stopping the use of the mask could have a negative impact on your health. What are some tips for using the machine?  Place your CPAP or BPAP machine on a secure table or stand near an electrical outlet.  Know where the on/off switch is located on the machine.  Follow instructions from your health care provider about how to set the pressure on your machine and when you should use it.  Do not eat or drink while the CPAP or BPAP machine is on. Food or fluids could get pushed into your lungs by the pressure of the CPAP or BPAP.  Do not smoke. Tobacco smoke  residue can damage the machine.  For home use, CPAP and BPAP machines can be rented or purchased through home health care companies. Many different brands of machines are available. Renting a machine before purchasing may help you find out which particular machine works well for you.  Keep the CPAP or BPAP machine and attachments clean. Ask your health care provider for specific instructions. Get help right away if:  You have redness or open areas around your nose or mouth where the mask fits.  You have trouble using the CPAP or BPAP machine.  You cannot tolerate wearing the CPAP or BPAP mask.  You have pain, discomfort, and bloating in your abdomen. Summary  CPAP and BPAP are methods of helping a person breathe with the use of air pressure.  Both CPAP and BPAP are provided by a small machine with a flexible plastic tube that attaches to a plastic mask.  If you have trouble with the mask, it is very important that you talk with your health care provider about finding a way to make the mask easier to tolerate. This information is not intended to replace advice given to you by your health care provider. Make sure you discuss any questions you have with your health care provider. Document Released: 12/31/2003 Document Revised: 07/24/2018 Document Reviewed: 02/21/2016 Elsevier Patient Education  2020 [employer]

## 2018-11-20 ENCOUNTER — Telehealth: Payer: Self-pay | Admitting: Primary Care

## 2018-11-20 DIAGNOSIS — G4733 Obstructive sleep apnea (adult) (pediatric): Secondary | ICD-10-CM

## 2018-11-20 DIAGNOSIS — Z9989 Dependence on other enabling machines and devices: Secondary | ICD-10-CM

## 2018-11-20 NOTE — Telephone Encounter (Signed)
Dale Hodges        The patients APAP setting are changed to 7-20 cm as APAP only goes to 20... not 22.   Please advise if you need other adjustments.   [name]

## 2018-11-20 NOTE — Telephone Encounter (Signed)
Epic message sent to [facility] to see if she is ok with me reordering the apap settings.

## 2018-11-22 NOTE — Telephone Encounter (Signed)
Cherina, please advise if you have received an update on this. Thanks!

## 2018-11-25 NOTE — Telephone Encounter (Signed)
I have not.   Beth, please advise if it is ok for [place] to change the order to 20cm since his machine will go to 20cm.

## 2018-11-25 NOTE — Telephone Encounter (Signed)
7-20cm H20

## 2018-11-25 NOTE — Telephone Encounter (Signed)
New order was sent

## 2018-12-10 ENCOUNTER — Ambulatory Visit: Payer: BLUE CROSS/BLUE SHIELD | Admitting: Internal Medicine

## 2018-12-18 DIAGNOSIS — G4733 Obstructive sleep apnea (adult) (pediatric): Secondary | ICD-10-CM | POA: Diagnosis not present

## 2019-01-15 ENCOUNTER — Other Ambulatory Visit: Payer: Self-pay | Admitting: Family Medicine

## 2019-01-15 DIAGNOSIS — I10 Essential (primary) hypertension: Secondary | ICD-10-CM

## 2019-01-17 DIAGNOSIS — G4733 Obstructive sleep apnea (adult) (pediatric): Secondary | ICD-10-CM | POA: Diagnosis not present

## 2019-02-04 ENCOUNTER — Other Ambulatory Visit: Payer: Self-pay | Admitting: Family Medicine

## 2019-02-04 DIAGNOSIS — I10 Essential (primary) hypertension: Secondary | ICD-10-CM

## 2019-02-18 ENCOUNTER — Other Ambulatory Visit: Payer: Self-pay | Admitting: Family Medicine

## 2019-04-10 DIAGNOSIS — G4733 Obstructive sleep apnea (adult) (pediatric): Secondary | ICD-10-CM | POA: Diagnosis not present

## 2019-05-21 ENCOUNTER — Other Ambulatory Visit: Payer: Self-pay

## 2019-05-21 ENCOUNTER — Ambulatory Visit (INDEPENDENT_AMBULATORY_CARE_PROVIDER_SITE_OTHER): Payer: BC Managed Care – PPO | Admitting: Internal Medicine

## 2019-05-21 ENCOUNTER — Encounter: Payer: Self-pay | Admitting: Internal Medicine

## 2019-05-21 DIAGNOSIS — I10 Essential (primary) hypertension: Secondary | ICD-10-CM

## 2019-05-21 DIAGNOSIS — G4733 Obstructive sleep apnea (adult) (pediatric): Secondary | ICD-10-CM

## 2019-05-21 NOTE — Patient Instructions (Signed)
We can continue CPAP auto 7-20, mask of choice, humidifier, supplies, AirView/ card  Please call if we can help

## 2019-05-21 NOTE — Assessment & Plan Note (Signed)
Benefits from CPAP with good compliance and control Plan- continue auto 7-20 

## 2019-05-21 NOTE — Assessment & Plan Note (Signed)
Aware of overlap medical issues between HBP and OSA

## 2019-05-21 NOTE — Progress Notes (Signed)
HPI M former smoker followed for OSA, complicated by HBP, Hyperlipidemia, Prediabetes, Bipolar,  HST 09/17/2018- AHI 82.1/ hr, desaturation to 59%, body weight 256 lbs --------------------------------------------------------------------  09/06/2018- 48 yoM former smoker for sleep evaluation. referred by Dr. Nani Ravens (PCP) for OSA evaluation, pt states he's gained weight, wakes up 3-4 times/night. Medical problem list includes HBP, Hyperlipidemia, Prediabetes Body weight today  256 lbs Epworth score 14 Wife tells him of witnessed apneas and loud snoring. Sleep is fragmented by frequent wakings, frequent nocturia. Severe fatigue, weight up.   No sleep meds. Daily large coffee and energy drink.  Brother on CPAP. Father + OSA, died pancreatic CA. Works daytime job as Customer service manager.  05/21/19- Virtual Visit via Telephone Note  I connected with Dennison Nancy on 05/21/19 at  9:00 AM EST by telephone and verified that I am speaking with the correct person using two identifiers.  Location: Patient: H Provider: O   I discussed the limitations, risks, security and privacy concerns of performing an evaluation and management service by telephone and the availability of in person appointments. I also discussed with the patient that there may be a patient responsible charge related to this service. The patient expressed understanding and agreed to proceed.   History of Present Illness: 7 yoM former smoker followed for OSA, complicated by HBP, Hyperlipidemia, Prediabetes, Bipolar,  CPAP  Auto 7-20/ Aerocare Download AHI 97%, AHI 1/ hr Reports very comfortable with CPAP and sleeping well.  Denies other interval medical concerns or changes. Occasionally uses Tylenol PM for sleep. 1 cup coffee in AM.    Observations/Objective: CPAP auto 7-20/ Aerocare Download 97%, AHI 1/ hr  Assessment and Plan:   Follow Up Instructions:    I discussed the assessment and treatment plan with the patient. The  patient was provided an opportunity to ask questions and all were answered. The patient agreed with the plan and demonstrated an understanding of the instructions.   The patient was advised to call back or seek an in-person evaluation if the symptoms worsen or if the condition fails to improve as anticipated.  I provided 17 minutes of non-face-to-face time during this encounter.   Baird Lyons, MD \  ROS-see HPI   + = positive Constitutional:    weight loss, night sweats, fevers, chills, +fatigue, lassitude. HEENT:   + headaches, difficulty swallowing, tooth/dental problems, sore throat,       sneezing, itching, ear ache, nasal congestion, post nasal drip, snoring CV:    chest pain, orthopnea, PND, +swelling in lower extremities, anasarca,                                  dizziness, palpitations Resp:   shortness of breath with exertion or at rest.                productive cough,   non-productive cough, coughing up of blood.              change in color of mucus.  wheezing.   Skin:    rash or lesions. GI:  No-   heartburn, indigestion, abdominal pain, nausea, vomiting, diarrhea,                 change in bowel habits, loss of appetite GU: dysuria, change in color of urine, no urgency or frequency.   flank pain. MS:   joint pain, stiffness, decreased range of motion, back pain. Neuro-  nothing unusual Psych:  change in mood or affect.  depression or anxiety.   memory loss.  OBJ- Physical Exam General- Alert, Oriented, Affect-appropriate, Distress- none acute, + obese Skin- rash-none, lesions- none, excoriation- none Lymphadenopathy- none Head- atraumatic            Eyes- Gross vision intact, PERRLA, conjunctivae and secretions clear            Ears- Hearing, canals-normal            Nose- Clear, no-Septal dev, mucus, polyps, erosion, perforation             Throat- Mallampati III , mucosa clear , drainage- none, tonsils- atrophic Neck- flexible , trachea midline, no stridor ,  thyroid nl, carotid no bruit Chest - symmetrical excursion , unlabored           Heart/CV- RRR , no murmur , no gallop  , no rub, nl s1 s2                           - JVD- none , edema- none, stasis changes- none, varices- none           Lung- clear to P&A, wheeze- none, cough- none , dullness-none, rub- none           Chest wall-  Abd-  Br/ Gen/ Rectal- Not done, not indicated Extrem- cyanosis- none, clubbing, none, atrophy- none, strength- nl Neuro- grossly intact to observation

## 2019-07-22 DIAGNOSIS — G4733 Obstructive sleep apnea (adult) (pediatric): Secondary | ICD-10-CM | POA: Diagnosis not present

## 2019-11-11 DIAGNOSIS — G4733 Obstructive sleep apnea (adult) (pediatric): Secondary | ICD-10-CM | POA: Diagnosis not present

## 2019-12-31 ENCOUNTER — Other Ambulatory Visit: Payer: Self-pay | Admitting: Family Medicine

## 2019-12-31 DIAGNOSIS — I10 Essential (primary) hypertension: Secondary | ICD-10-CM

## 2019-12-31 MED ORDER — AMLODIPINE BESYLATE 10 MG PO TABS: 10.0000 mg | ORAL_TABLET | Freq: Every day | ORAL | 0 refills | Status: DC

## 2019-12-31 NOTE — Telephone Encounter (Signed)
Patient has scheduled a cpe for 02/18/20. He would like labs a week before.  Medication: amLODipine (NORVASC) 10 MG tablet [[id number]]       Has the patient contacted their pharmacy? (If no, request that the patient contact the pharmacy for the refill.) (If yes, when and what did the pharmacy advise?)     Preferred Pharmacy (with phone number or street name):  [id number] - [place], [place] - [address] AT [facility] [address] Phone:  [phone]  Fax:  [phone]          Agent: Please be advised that RX refills may take up to 3 business days. We ask that you follow-up with your pharmacy.

## 2019-12-31 NOTE — Telephone Encounter (Signed)
I have refilled his medication. Let me know if ok to do labs one week prior to appt/what to order/diagnosis codes??

## 2020-01-01 NOTE — Telephone Encounter (Signed)
Lipid panel/CMP (mixed hyperlipidemia), A1c (prediabetes). Ty.

## 2020-01-02 ENCOUNTER — Other Ambulatory Visit: Payer: Self-pay | Admitting: Family Medicine

## 2020-01-02 DIAGNOSIS — R7303 Prediabetes: Secondary | ICD-10-CM

## 2020-01-02 DIAGNOSIS — E782 Mixed hyperlipidemia: Secondary | ICD-10-CM

## 2020-01-02 NOTE — Telephone Encounter (Signed)
Called left message to call back. Put in future orders one week before 02/18/20,,, orders placed for expected around 02/11/2020

## 2020-02-10 DIAGNOSIS — G4733 Obstructive sleep apnea (adult) (pediatric): Secondary | ICD-10-CM | POA: Diagnosis not present

## 2020-02-13 ENCOUNTER — Other Ambulatory Visit: Payer: Self-pay

## 2020-02-13 ENCOUNTER — Other Ambulatory Visit (INDEPENDENT_AMBULATORY_CARE_PROVIDER_SITE_OTHER): Payer: BC Managed Care – PPO

## 2020-02-13 DIAGNOSIS — R7303 Prediabetes: Secondary | ICD-10-CM

## 2020-02-13 DIAGNOSIS — E782 Mixed hyperlipidemia: Secondary | ICD-10-CM | POA: Diagnosis not present

## 2020-02-14 LAB — COMPREHENSIVE METABOLIC PANEL
AG Ratio: 2 (calc) (ref 1.0–2.5)
ALT: 44 U/L (ref 9–46)
AST: 25 U/L (ref 10–40)
Albumin: 4.4 g/dL (ref 3.6–5.1)
Alkaline phosphatase (APISO): 68 U/L (ref 36–130)
BUN: 13 mg/dL (ref 7–25)
CO2: 28 mmol/L (ref 20–32)
Calcium: 9 mg/dL (ref 8.6–10.3)
Chloride: 103 mmol/L (ref 98–110)
Creat: 0.93 mg/dL (ref 0.60–1.35)
Globulin: 2.2 g/dL (calc) (ref 1.9–3.7)
Glucose, Bld: 129 mg/dL — ABNORMAL HIGH (ref 65–99)
Potassium: 4.1 mmol/L (ref 3.5–5.3)
Sodium: 142 mmol/L (ref 135–146)
Total Bilirubin: 0.4 mg/dL (ref 0.2–1.2)
Total Protein: 6.6 g/dL (ref 6.1–8.1)

## 2020-02-14 LAB — LIPID PANEL
Cholesterol: 159 mg/dL (ref <200)
HDL: 31 mg/dL — ABNORMAL LOW (ref >=40)
LDL Cholesterol (Calc): 98 mg/dL (calc)
Non-HDL Cholesterol (Calc): 128 mg/dL (calc) (ref <130)
Total CHOL/HDL Ratio: 5.1 (calc) — ABNORMAL HIGH (ref <5.0)
Triglycerides: 207 mg/dL — ABNORMAL HIGH (ref <150)

## 2020-02-14 LAB — HEMOGLOBIN A1C
Hgb A1c MFr Bld: 5.4 % of total Hgb (ref <5.7)
Mean Plasma Glucose: 108 (calc)
eAG (mmol/L): 6 (calc)

## 2020-02-18 ENCOUNTER — Encounter: Payer: Self-pay | Admitting: Family Medicine

## 2020-02-18 ENCOUNTER — Other Ambulatory Visit: Payer: Self-pay

## 2020-02-18 ENCOUNTER — Ambulatory Visit (INDEPENDENT_AMBULATORY_CARE_PROVIDER_SITE_OTHER): Payer: BC Managed Care – PPO | Admitting: Family Medicine

## 2020-02-18 VITALS — BP 126/80 | HR 82 | Temp 98.3°F | Ht 70.0 in | Wt 251.5 lb

## 2020-02-18 DIAGNOSIS — R7303 Prediabetes: Secondary | ICD-10-CM

## 2020-02-18 DIAGNOSIS — Z1159 Encounter for screening for other viral diseases: Secondary | ICD-10-CM | POA: Diagnosis not present

## 2020-02-18 DIAGNOSIS — Z23 Encounter for immunization: Secondary | ICD-10-CM | POA: Diagnosis not present

## 2020-02-18 DIAGNOSIS — Z0000 Encounter for general adult medical examination without abnormal findings: Secondary | ICD-10-CM | POA: Diagnosis not present

## 2020-02-18 DIAGNOSIS — E782 Mixed hyperlipidemia: Secondary | ICD-10-CM | POA: Diagnosis not present

## 2020-02-18 NOTE — Patient Instructions (Addendum)
Keep the diet clean and stay active.  Contact your insurance company to see if they cover colon cancer screening at your age. If so, please let our office know.  OK to use Debrox (peroxide) in the ear to loosen up wax. Also recommend using a bulb syringe (for removing boogers from baby's noses) to flush through warm water. Do not use Q-tips as this can impact wax further.  Let us know if you need anything.

## 2020-02-18 NOTE — Progress Notes (Signed)
Chief Complaint  Patient presents with  . Follow-up    Well Male Dale Hodges is here for a complete physical.   His last physical was >1 year ago.  Current diet: in general, a "healthy" diet.   Current exercise: swimming, wts Weight trend: stable Fatigue out of ordinary? No  Seat belt? Yes.    Health maintenance Tetanus- Yes HIV- Yes Hep C- No  Past Medical History:  Diagnosis Date  . Bipolar 1 disorder ([facility])   . Branch retinal vein occlusion    right eye  . Diverticulitis   . History of chicken pox   . Hypertension   . Mixed hyperlipidemia   . Prediabetes      Past Surgical History:  Procedure Laterality Date  . ABDOMINAL SURGERY    . COLON RESECTION    . COLOSTOMY REVERSAL      Medications  Current Outpatient Medications on File Prior to Visit  Medication Sig Dispense Refill  . amLODipine (NORVASC) 10 MG tablet Take 1 tablet (10 mg total) by mouth daily. 90 tablet 0  . atorvastatin (LIPITOR) 40 MG tablet TAKE 1 TABLET(40 MG) BY MOUTH DAILY 90 tablet 3  . losartan (COZAAR) 100 MG tablet TAKE 1 TABLET(100 MG) BY MOUTH DAILY 90 tablet 3   Allergies Allergies  Allergen Reactions  . Codeine     Triggers manic episode    Family History Family History  Problem Relation Age of Onset  . Hypertension Mother   . Cancer Sister   . Diabetes Sister   . Hypertension Brother     Review of Systems: Constitutional: no fevers or chills Eye:  no recent significant change in vision Ear/Nose/Mouth/Throat:  Ears:  no hearing loss Nose/Mouth/Throat:  no complaints of nasal congestion, no sore throat Cardiovascular:  no chest pain Respiratory:  no shortness of breath Gastrointestinal:  no abdominal pain, no change in bowel habits GU:  Male: negative for dysuria, frequency, and incontinence Musculoskeletal/Extremities:  no pain of the joints Integumentary (Skin/Breast):  no abnormal skin lesions reported Neurologic:  no headaches Endocrine: No unexpected  weight changes Hematologic/Lymphatic:  no night sweats  Exam BP 126/80 (BP Location: Right Arm, Patient Position: Sitting, Cuff Size: Normal)   Pulse 82   Temp 98.3 F (36.8 C) (Oral)   Ht 5\' 10"  (1.778 m)   Wt 251 lb 8 oz (114.1 kg)   SpO2 98%   BMI 36.09 kg/m  General:  well developed, well nourished, in no apparent distress Skin:  no significant moles, warts, or growths Head:  no masses, lesions, or tenderness Eyes:  pupils equal and round, sclera anicteric without injection Ears:  canals without lesions, TMs shiny without retraction, no obvious effusion, no erythema Nose:  nares patent, septum midline, mucosa normal Throat/Pharynx:  lips and gingiva without lesion; tongue and uvula midline; non-inflamed pharynx; no exudates or postnasal drainage Neck: neck supple without adenopathy, thyromegaly, or masses Lungs:  clear to auscultation, breath sounds equal bilaterally, no respiratory distress Cardio:  regular rate and rhythm, no bruits, no LE edema Abdomen:  abdomen soft, nontender; bowel sounds normal; no masses or organomegaly Rectal: Deferred Musculoskeletal:  symmetrical muscle groups noted without atrophy or deformity Extremities:  no clubbing, cyanosis, or edema, no deformities, no skin discoloration Neuro:  gait normal; deep tendon reflexes normal and symmetric Psych: well oriented with normal range of affect and appropriate judgment/insight  Assessment and Plan  Well adult exam  Mixed hyperlipidemia - Plan: Lipid panel, Comprehensive metabolic panel  Prediabetes -  Plan: Hemoglobin A1c  Encounter for hepatitis C screening test for low risk patient - Plan: Hepatitis C antibody  Need for influenza vaccination - Plan: Flu Vaccine QUAD 6+ mos PF IM (Fluarix Quad PF)   Well 49 y.o. male. Counseled on diet and exercise. Counseled on risks and benefits of prostate cancer screening with PSA. The patient agrees to forego screening.  Other orders as above. Follow up in 6  mo, labs prior to visit in future. The patient voiced understanding and agreement to the plan.  Jilda Roche [place], DO 02/18/20 8:40 AM

## 2020-03-25 DIAGNOSIS — L237 Allergic contact dermatitis due to plants, except food: Secondary | ICD-10-CM | POA: Diagnosis not present

## 2020-03-28 ENCOUNTER — Other Ambulatory Visit: Payer: Self-pay | Admitting: Family Medicine

## 2020-03-28 DIAGNOSIS — I10 Essential (primary) hypertension: Secondary | ICD-10-CM

## 2020-04-23 DIAGNOSIS — Z20828 Contact with and (suspected) exposure to other viral communicable diseases: Secondary | ICD-10-CM | POA: Diagnosis not present

## 2020-05-20 ENCOUNTER — Ambulatory Visit: Payer: Self-pay | Admitting: Internal Medicine

## 2020-05-26 DIAGNOSIS — G4733 Obstructive sleep apnea (adult) (pediatric): Secondary | ICD-10-CM | POA: Diagnosis not present

## 2020-06-01 DIAGNOSIS — I10 Essential (primary) hypertension: Secondary | ICD-10-CM

## 2020-06-01 MED ORDER — LOSARTAN POTASSIUM 100 MG PO TABS: 100.0000 mg | ORAL_TABLET | Freq: Every day | ORAL | 3 refills | Status: DC

## 2020-06-01 MED ORDER — ATORVASTATIN CALCIUM 40 MG PO TABS
ORAL_TABLET | ORAL | 3 refills | Status: DC
Start: 2020-06-01 — End: 2021-07-21

## 2020-07-06 NOTE — Progress Notes (Signed)
HPI M former smoker followed for OSA, complicated by HBP, Hyperlipidemia, Prediabetes, Bipolar,  HST 09/17/2018- AHI 82.1/ hr, desaturation to 59%, body weight 256 lbs -------------------------------------------------------------------  05/21/19- Virtual Visit via Telephone Note  History of Present Illness: 68 yoM former smoker followed for OSA, complicated by HBP, Hyperlipidemia, Prediabetes, Bipolar,  CPAP  Auto 7-20/ [employer] Download AHI 97%, AHI 1/ hr Reports very comfortable with CPAP and sleeping well.  Denies other interval medical concerns or changes. Occasionally uses Tylenol PM for sleep. 1 cup coffee in AM.    Observations/Objective: CPAP auto 7-20/ [employer] Download 97%, AHI 1/ hr  Assessment and Plan:   Follow Up Instructions:   07/06/20- 50 yoM former smoker followed for OSA, complicated by HBP, Hyperlipidemia, Prediabetes, Bipolar, Obese,  CPAP  Auto 7-20/ [employer] Download- compliance 100%, AHI 0.7/ hr Body weight today-2246 lbs Covid vax-2 Phizer Flu vax-had Sleeping well with CPAP and denies concerns.  No interval health issues.   ROS-see HPI   + = positive Constitutional:    weight loss, night sweats, fevers, chills, +fatigue, lassitude. HEENT:   + headaches, difficulty swallowing, tooth/dental problems, sore throat,       sneezing, itching, ear ache, nasal congestion, post nasal drip, snoring CV:    chest pain, orthopnea, PND, +swelling in lower extremities, anasarca,                                   dizziness, palpitations Resp:   shortness of breath with exertion or at rest.                productive cough,   non-productive cough, coughing up of blood.              change in color of mucus.  wheezing.   Skin:    rash or lesions. GI:  No-   heartburn, indigestion, abdominal pain, nausea, vomiting, diarrhea,                 change in bowel habits, loss of appetite GU: dysuria, change in color of urine, no urgency or frequency.   flank pain. MS:   joint  pain, stiffness, decreased range of motion, back pain. Neuro-     nothing unusual Psych:  change in mood or affect.  depression or anxiety.   memory loss.  OBJ- Physical Exam General- Alert, Oriented, Affect-appropriate, Distress- none acute, + obese Skin- rash-none, lesions- none, excoriation- none Lymphadenopathy- none Head- atraumatic            Eyes- Gross vision intact, PERRLA, conjunctivae and secretions clear            Ears- Hearing, canals-normal            Nose- Clear, no-Septal dev, mucus, polyps, erosion, perforation             Throat- Mallampati III , mucosa clear , drainage- none, tonsils- atrophic Neck- flexible , trachea midline, no stridor , thyroid nl, carotid no bruit Chest - symmetrical excursion , unlabored           Heart/CV- RRR , no murmur , no gallop  , no rub, nl s1 s2                           - JVD- none , edema- none, stasis changes- none, varices- none  Lung- clear to P&A, wheeze- none, cough- none , dullness-none, rub- none           Chest wall-  Abd-  Br/ Gen/ Rectal- Not done, not indicated Extrem- cyanosis- none, clubbing, none, atrophy- none, strength- nl Neuro- grossly intact to observation

## 2020-07-07 ENCOUNTER — Encounter: Payer: Self-pay | Admitting: Internal Medicine

## 2020-07-07 ENCOUNTER — Other Ambulatory Visit: Payer: Self-pay

## 2020-07-07 ENCOUNTER — Ambulatory Visit: Payer: BC Managed Care – PPO | Admitting: Internal Medicine

## 2020-07-07 DIAGNOSIS — Z6835 Body mass index (BMI) 35.0-35.9, adult: Secondary | ICD-10-CM | POA: Diagnosis not present

## 2020-07-07 DIAGNOSIS — G4733 Obstructive sleep apnea (adult) (pediatric): Secondary | ICD-10-CM

## 2020-07-07 NOTE — Patient Instructions (Signed)
We can continue CPAP auto 7-20 ° °Please call if we can help °

## 2020-07-12 ENCOUNTER — Other Ambulatory Visit: Payer: Self-pay | Admitting: Family Medicine

## 2020-07-12 DIAGNOSIS — I10 Essential (primary) hypertension: Secondary | ICD-10-CM

## 2020-08-10 ENCOUNTER — Other Ambulatory Visit: Payer: Self-pay

## 2020-08-10 ENCOUNTER — Other Ambulatory Visit (INDEPENDENT_AMBULATORY_CARE_PROVIDER_SITE_OTHER): Payer: BC Managed Care – PPO

## 2020-08-10 DIAGNOSIS — E782 Mixed hyperlipidemia: Secondary | ICD-10-CM

## 2020-08-10 DIAGNOSIS — Z1159 Encounter for screening for other viral diseases: Secondary | ICD-10-CM

## 2020-08-10 DIAGNOSIS — R7303 Prediabetes: Secondary | ICD-10-CM | POA: Diagnosis not present

## 2020-08-10 LAB — COMPREHENSIVE METABOLIC PANEL
ALT: 43 U/L (ref 0–53)
AST: 19 U/L (ref 0–37)
Albumin: 4 g/dL (ref 3.5–5.2)
Alkaline Phosphatase: 71 U/L (ref 39–117)
BUN: 10 mg/dL (ref 6–23)
CO2: 28 mEq/L (ref 19–32)
Calcium: 8.5 mg/dL (ref 8.4–10.5)
Chloride: 104 mEq/L (ref 96–112)
Creatinine, Ser: 0.83 mg/dL (ref 0.40–1.50)
GFR: 103.77 mL/min (ref >60.00)
Glucose, Bld: 120 mg/dL — ABNORMAL HIGH (ref 70–99)
Potassium: 3.4 mEq/L — ABNORMAL LOW (ref 3.5–5.1)
Sodium: 141 mEq/L (ref 135–145)
Total Bilirubin: 0.5 mg/dL (ref 0.2–1.2)
Total Protein: 6.5 g/dL (ref 6.0–8.3)

## 2020-08-10 LAB — HEMOGLOBIN A1C: Hgb A1c MFr Bld: 5.5 % (ref 4.6–6.5)

## 2020-08-10 LAB — LIPID PANEL
Cholesterol: 136 mg/dL (ref 0–200)
HDL: 31.1 mg/dL — ABNORMAL LOW (ref >39.00)
LDL Cholesterol: 68 mg/dL (ref 0–99)
NonHDL: 104.93
Total CHOL/HDL Ratio: 4
Triglycerides: 184 mg/dL — ABNORMAL HIGH (ref 0.0–149.0)
VLDL: 36.8 mg/dL (ref 0.0–40.0)

## 2020-08-10 NOTE — Addendum Note (Signed)
Addended by: Mervin Kung A on: 08/10/2020 08:01 AM   Modules accepted: Orders

## 2020-08-11 ENCOUNTER — Other Ambulatory Visit: Payer: Self-pay | Admitting: Family Medicine

## 2020-08-11 DIAGNOSIS — E876 Hypokalemia: Secondary | ICD-10-CM

## 2020-08-11 LAB — HEPATITIS C ANTIBODY
Hepatitis C Ab: NONREACTIVE
SIGNAL TO CUT-OFF: 0.01 (ref <1.00)

## 2020-08-17 ENCOUNTER — Ambulatory Visit: Payer: BC Managed Care – PPO | Admitting: Family Medicine

## 2020-08-31 ENCOUNTER — Other Ambulatory Visit (INDEPENDENT_AMBULATORY_CARE_PROVIDER_SITE_OTHER): Payer: BC Managed Care – PPO

## 2020-08-31 ENCOUNTER — Other Ambulatory Visit: Payer: Self-pay

## 2020-08-31 DIAGNOSIS — E876 Hypokalemia: Secondary | ICD-10-CM

## 2020-08-31 LAB — BASIC METABOLIC PANEL
BUN: 14 mg/dL (ref 6–23)
CO2: 32 mEq/L (ref 19–32)
Calcium: 8.9 mg/dL (ref 8.4–10.5)
Chloride: 102 mEq/L (ref 96–112)
Creatinine, Ser: 1.05 mg/dL (ref 0.40–1.50)
GFR: 84.13 mL/min (ref >60.00)
Glucose, Bld: 115 mg/dL — ABNORMAL HIGH (ref 70–99)
Potassium: 3.8 mEq/L (ref 3.5–5.1)
Sodium: 141 mEq/L (ref 135–145)

## 2020-09-29 ENCOUNTER — Ambulatory Visit: Payer: BC Managed Care – PPO | Admitting: Family Medicine

## 2020-10-08 ENCOUNTER — Other Ambulatory Visit: Payer: Self-pay | Admitting: Family Medicine

## 2020-10-08 ENCOUNTER — Ambulatory Visit: Payer: BC Managed Care – PPO | Admitting: Family Medicine

## 2020-10-08 ENCOUNTER — Other Ambulatory Visit: Payer: Self-pay

## 2020-10-08 ENCOUNTER — Encounter: Payer: Self-pay | Admitting: Family Medicine

## 2020-10-08 VITALS — BP 120/80 | HR 76 | Temp 98.5°F | Ht 70.0 in | Wt 248.1 lb

## 2020-10-08 DIAGNOSIS — I10 Essential (primary) hypertension: Secondary | ICD-10-CM

## 2020-10-08 DIAGNOSIS — Z6835 Body mass index (BMI) 35.0-35.9, adult: Secondary | ICD-10-CM

## 2020-10-08 DIAGNOSIS — E782 Mixed hyperlipidemia: Secondary | ICD-10-CM

## 2020-10-08 LAB — LIPID PANEL
Cholesterol: 155 mg/dL (ref 0–200)
HDL: 33.2 mg/dL — ABNORMAL LOW (ref >39.00)
LDL Cholesterol: 92 mg/dL (ref 0–99)
NonHDL: 121.42
Total CHOL/HDL Ratio: 5
Triglycerides: 147 mg/dL (ref 0.0–149.0)
VLDL: 29.4 mg/dL (ref 0.0–40.0)

## 2020-10-08 NOTE — Progress Notes (Signed)
Chief Complaint  Patient presents with   Follow-up    Subjective Dale Hodges is a 50 y.o. male who presents for hypertension follow up. He does monitor home blood pressures. Blood pressures ranging from 120's/70-80's on average. He is compliant with medications- losartan 100 mg/d, Norvasc 10 mg/d. Patient has these side effects of medication: none He is usually adhering to a healthy diet overall. Current exercise: some swimming No CP or SOB.  Hyperlipidemia Patient presents for dyslipidemia follow up. Currently being treated with Lipitor 40 mg/d and compliance with treatment thus far has been good. He denies myalgias. The patient is not known to have coexisting coronary artery disease.   Past Medical History:  Diagnosis Date   Bipolar 1 disorder (HCC)    Branch retinal vein occlusion    right eye   Diverticulitis    History of chicken pox    Hypertension    Mixed hyperlipidemia    Prediabetes     Exam BP 120/80   Pulse 76   Temp 98.5 F (36.9 C) (Oral)   Ht 5\' 10"  (1.778 m)   Wt 248 lb 2 oz (112.5 kg)   SpO2 97%   BMI 35.60 kg/m  General:  well developed, well nourished, in no apparent distress Heart: RRR, no bruits, no LE edema Lungs: clear to auscultation, no accessory muscle use Psych: well oriented with normal range of affect and appropriate judgment/insight  Essential hypertension  Mixed hyperlipidemia - Plan: Lipid panel  Severe obesity (BMI 35.0-35.9 with comorbidity) (HCC)  1  Chronic, stable. Cont Norvasc 10 mg/d, losartan 100 mg/d. Counseled on diet and exercise. 2. Chronic, uncontrollled. Cont Lipitor 40 mg/d. May increase if not controlled, he would prefer to avoid another med. He plans to start cycling again.  F/u in 6 mo or prn. The patient voiced understanding and agreement to the plan.  10-02-1976 [place], DO 10/08/20  10:16 AM

## 2020-10-08 NOTE — Patient Instructions (Signed)
Give us 2-3 business days to get the results of your labs back.   Keep the diet clean and stay active.  Let us know if you need anything. 

## 2020-10-23 NOTE — Assessment & Plan Note (Signed)
Encouraged to keep weight down. Possible candidate for [facility]

## 2020-10-23 NOTE — Assessment & Plan Note (Signed)
Benefits from CPAP with good compliance and control Plan- continue auto 7-20

## 2020-12-29 ENCOUNTER — Other Ambulatory Visit: Payer: Self-pay | Admitting: Family Medicine

## 2020-12-29 DIAGNOSIS — I10 Essential (primary) hypertension: Secondary | ICD-10-CM

## 2021-03-29 ENCOUNTER — Other Ambulatory Visit: Payer: Self-pay | Admitting: Family Medicine

## 2021-03-29 DIAGNOSIS — I10 Essential (primary) hypertension: Secondary | ICD-10-CM

## 2021-04-12 ENCOUNTER — Encounter: Payer: BC Managed Care – PPO | Admitting: Family Medicine

## 2021-04-14 ENCOUNTER — Encounter: Payer: Self-pay | Admitting: Family Medicine

## 2021-04-14 ENCOUNTER — Ambulatory Visit (INDEPENDENT_AMBULATORY_CARE_PROVIDER_SITE_OTHER): Payer: BC Managed Care – PPO | Admitting: Family Medicine

## 2021-04-14 VITALS — BP 124/70 | HR 77 | Temp 98.1°F | Ht 70.0 in | Wt 248.8 lb

## 2021-04-14 DIAGNOSIS — Z23 Encounter for immunization: Secondary | ICD-10-CM

## 2021-04-14 DIAGNOSIS — Z0000 Encounter for general adult medical examination without abnormal findings: Secondary | ICD-10-CM

## 2021-04-14 DIAGNOSIS — Z1211 Encounter for screening for malignant neoplasm of colon: Secondary | ICD-10-CM

## 2021-04-14 LAB — COMPREHENSIVE METABOLIC PANEL
ALT: 45 U/L (ref 0–53)
AST: 22 U/L (ref 0–37)
Albumin: 4.2 g/dL (ref 3.5–5.2)
Alkaline Phosphatase: 75 U/L (ref 39–117)
BUN: 14 mg/dL (ref 6–23)
CO2: 30 mEq/L (ref 19–32)
Calcium: 8.7 mg/dL (ref 8.4–10.5)
Chloride: 101 mEq/L (ref 96–112)
Creatinine, Ser: 1.06 mg/dL (ref 0.40–1.50)
GFR: 82.81 mL/min (ref >60.00)
Glucose, Bld: 135 mg/dL — ABNORMAL HIGH (ref 70–99)
Potassium: 3.3 mEq/L — ABNORMAL LOW (ref 3.5–5.1)
Sodium: 140 mEq/L (ref 135–145)
Total Bilirubin: 0.7 mg/dL (ref 0.2–1.2)
Total Protein: 6.7 g/dL (ref 6.0–8.3)

## 2021-04-14 LAB — LIPID PANEL
Cholesterol: 160 mg/dL (ref 0–200)
HDL: 28.7 mg/dL — ABNORMAL LOW (ref >39.00)
NonHDL: 131.73
Total CHOL/HDL Ratio: 6
Triglycerides: 240 mg/dL — ABNORMAL HIGH (ref 0.0–149.0)
VLDL: 48 mg/dL — ABNORMAL HIGH (ref 0.0–40.0)

## 2021-04-14 LAB — CBC
HCT: 44.7 % (ref 39.0–52.0)
Hemoglobin: 15.1 g/dL (ref 13.0–17.0)
MCHC: 33.9 g/dL (ref 30.0–36.0)
MCV: 90.4 fl (ref 78.0–100.0)
Platelets: 213 10*3/uL (ref 150.0–400.0)
RBC: 4.94 Mil/uL (ref 4.22–5.81)
RDW: 13.3 % (ref 11.5–15.5)
WBC: 7.9 10*3/uL (ref 4.0–10.5)

## 2021-04-14 LAB — LDL CHOLESTEROL, DIRECT: Direct LDL: 109 mg/dL

## 2021-04-14 NOTE — Patient Instructions (Addendum)
Give us 2-3 business days to get the results of your labs back.   Keep the diet clean and stay active.  If you do not hear anything about your referral in the next 1-2 weeks, call our office and ask for an update.  I recommend getting the updated bivalent covid vaccination booster at your convenience.   Let us know if you need anything.  

## 2021-04-14 NOTE — Progress Notes (Signed)
Chief Complaint  Patient presents with   Annual Exam    CPE-fasting     Well Male Dale Hodges is here for a complete physical.   His last physical was >1 year ago.  Current diet: in general, a "healthy" diet.   Current exercise: cycling Weight trend: stable Fatigue out of ordinary? No. Seat belt? Yes.   Advanced directive? No  Health maintenance Tetanus- Yes HIV- Yes Hep C- Yes  Past Medical History:  Diagnosis Date   Bipolar 1 disorder (HCC)    Branch retinal vein occlusion    right eye   Diverticulitis    History of chicken pox    Hypertension    Mixed hyperlipidemia    Prediabetes      Past Surgical History:  Procedure Laterality Date   ABDOMINAL SURGERY     COLON RESECTION     COLOSTOMY REVERSAL      Medications  Current Outpatient Medications on File Prior to Visit  Medication Sig Dispense Refill   amLODipine (NORVASC) 10 MG tablet TAKE 1 TABLET(10 MG) BY MOUTH DAILY 90 tablet 0   atorvastatin (LIPITOR) 40 MG tablet TAKE 1 TABLET(40 MG) BY MOUTH DAILY 90 tablet 3   losartan (COZAAR) 100 MG tablet Take 1 tablet (100 mg total) by mouth daily. 90 tablet 3    Allergies Allergies  Allergen Reactions   Codeine     Triggers manic episode    Family History Family History  Problem Relation Age of Onset   Hypertension Mother    Cancer Sister    Diabetes Sister    Hypertension Brother     Review of Systems: Constitutional: no fevers or chills Eye:  no recent significant change in vision Ear/Nose/Mouth/Throat:  Ears:  no hearing loss Nose/Mouth/Throat:  no complaints of nasal congestion, no sore throat Cardiovascular:  no chest pain Respiratory:  no shortness of breath Gastrointestinal:  no abdominal pain, no change in bowel habits GU:  Male: negative for dysuria, frequency, and incontinence Musculoskeletal/Extremities:  no pain of the joints Integumentary (Skin/Breast):  no abnormal skin lesions reported Neurologic:  no  headaches Endocrine: No unexpected weight changes Hematologic/Lymphatic:  no night sweats  Exam BP 124/70    Pulse 77    Temp 98.1 F (36.7 C) (Oral)    Ht 5\' 10"  (1.778 m)    Wt 248 lb 12.8 oz (112.9 kg)    SpO2 97%    BMI 35.70 kg/m  General:  well developed, well nourished, in no apparent distress Skin:  no significant moles, warts, or growths Head:  no masses, lesions, or tenderness Eyes:  pupils equal and round, sclera anicteric without injection Ears:  canals without lesions, TMs shiny without retraction, no obvious effusion, no erythema Nose:  nares patent, septum midline, mucosa normal Throat/Pharynx:  lips and gingiva without lesion; tongue and uvula midline; non-inflamed pharynx; no exudates or postnasal drainage Neck: neck supple without adenopathy, thyromegaly, or masses Lungs:  clear to auscultation, breath sounds equal bilaterally, no respiratory distress Cardio:  regular rate and rhythm, no bruits, no LE edema Abdomen:  abdomen soft, nontender; bowel sounds normal; no masses or organomegaly Rectal: Deferred Musculoskeletal:  symmetrical muscle groups noted without atrophy or deformity Extremities:  no clubbing, cyanosis, or edema, no deformities, no skin discoloration Neuro:  gait normal; deep tendon reflexes normal and symmetric Psych: well oriented with normal range of affect and appropriate judgment/insight  Assessment and Plan  Well adult exam - Plan: CBC, Comprehensive metabolic panel, Lipid panel  Screen for colon cancer - Plan: Ambulatory referral to Gastroenterology   Well 50 y.o. male. Counseled on diet and exercise. Counseled on risks and benefits of prostate cancer screening with PSA. The patient agrees to forego screening.  [facility]: refer GI.  Flu shot today. Bivalent covid booster rec'd.  Other orders as above. Follow up in 6 mo pending the above workup. The patient voiced understanding and agreement to the plan.  Jilda Roche [place],  DO 04/14/21 8:00 AM

## 2021-04-15 ENCOUNTER — Other Ambulatory Visit: Payer: Self-pay | Admitting: Family Medicine

## 2021-04-15 DIAGNOSIS — E876 Hypokalemia: Secondary | ICD-10-CM

## 2021-04-15 DIAGNOSIS — E782 Mixed hyperlipidemia: Secondary | ICD-10-CM

## 2021-04-29 ENCOUNTER — Other Ambulatory Visit (INDEPENDENT_AMBULATORY_CARE_PROVIDER_SITE_OTHER): Payer: BC Managed Care – PPO

## 2021-04-29 ENCOUNTER — Other Ambulatory Visit: Payer: BC Managed Care – PPO

## 2021-04-29 DIAGNOSIS — E782 Mixed hyperlipidemia: Secondary | ICD-10-CM

## 2021-04-29 DIAGNOSIS — E876 Hypokalemia: Secondary | ICD-10-CM

## 2021-04-29 LAB — BASIC METABOLIC PANEL
BUN: 17 mg/dL (ref 6–23)
CO2: 29 mEq/L (ref 19–32)
Calcium: 9.1 mg/dL (ref 8.4–10.5)
Chloride: 103 mEq/L (ref 96–112)
Creatinine, Ser: 0.93 mg/dL (ref 0.40–1.50)
GFR: 96.86 mL/min (ref >60.00)
Glucose, Bld: 109 mg/dL — ABNORMAL HIGH (ref 70–99)
Potassium: 3.5 mEq/L (ref 3.5–5.1)
Sodium: 142 mEq/L (ref 135–145)

## 2021-04-29 LAB — LIPID PANEL
Cholesterol: 154 mg/dL (ref 0–200)
HDL: 29.4 mg/dL — ABNORMAL LOW (ref >39.00)
LDL Cholesterol: 98 mg/dL (ref 0–99)
NonHDL: 124.81
Total CHOL/HDL Ratio: 5
Triglycerides: 136 mg/dL (ref 0.0–149.0)
VLDL: 27.2 mg/dL (ref 0.0–40.0)

## 2021-06-27 ENCOUNTER — Other Ambulatory Visit: Payer: Self-pay | Admitting: Family Medicine

## 2021-06-27 DIAGNOSIS — I10 Essential (primary) hypertension: Secondary | ICD-10-CM

## 2021-07-21 ENCOUNTER — Other Ambulatory Visit: Payer: Self-pay | Admitting: Family Medicine

## 2021-07-21 DIAGNOSIS — I10 Essential (primary) hypertension: Secondary | ICD-10-CM

## 2021-09-21 ENCOUNTER — Other Ambulatory Visit: Payer: Self-pay | Admitting: Family Medicine

## 2021-09-21 DIAGNOSIS — I10 Essential (primary) hypertension: Secondary | ICD-10-CM

## 2021-10-28 ENCOUNTER — Ambulatory Visit: Payer: BC Managed Care – PPO | Admitting: Family Medicine

## 2021-10-28 ENCOUNTER — Encounter: Payer: Self-pay | Admitting: Family Medicine

## 2021-10-28 VITALS — BP 110/72 | HR 86 | Temp 98.0°F | Ht 70.0 in | Wt 242.0 lb

## 2021-10-28 DIAGNOSIS — E782 Mixed hyperlipidemia: Secondary | ICD-10-CM | POA: Diagnosis not present

## 2021-10-28 DIAGNOSIS — R739 Hyperglycemia, unspecified: Secondary | ICD-10-CM

## 2021-10-28 DIAGNOSIS — Z1211 Encounter for screening for malignant neoplasm of colon: Secondary | ICD-10-CM | POA: Diagnosis not present

## 2021-10-28 DIAGNOSIS — I10 Essential (primary) hypertension: Secondary | ICD-10-CM

## 2021-10-28 NOTE — Progress Notes (Signed)
Chief Complaint  Patient presents with   Annual Exam    Subjective Dale Hodges is a 51 y.o. male who presents for hypertension follow up. He does monitor home blood pressures. Blood pressures ranging from 120's/80's on average. He is compliant with medications- Norvasc 10 mg/d, losartan 100 mg/d. Patient has these side effects of medication: none He is adhering to a healthy diet overall. Current exercise: swimming No CP or SOB.   Hyperlipidemia Patient presents for dyslipidemia follow up. Currently being treated with Lipitor 40 mg/d and compliance with treatment thus far has been good. He denies myalgias. Diet/exercise as above.  The patient is not known to have coexisting coronary artery disease.   Past Medical History:  Diagnosis Date   Bipolar 1 disorder (HCC)    Branch retinal vein occlusion    right eye   Diverticulitis    History of chicken pox    Hypertension    Mixed hyperlipidemia    Prediabetes     Exam BP 110/72   Pulse 86   Temp 98 F (36.7 C) (Oral)   Ht 5\' 10"  (1.778 m)   Wt 242 lb (109.8 kg)   SpO2 97%   BMI 34.72 kg/m  General:  well developed, well nourished, in no apparent distress Heart: RRR, no bruits, no LE edema Lungs: clear to auscultation, no accessory muscle use Psych: well oriented with normal range of affect and appropriate judgment/insight  Essential hypertension  Mixed hyperlipidemia - Plan: Comprehensive metabolic panel, Lipid panel  Hyperglycemia - Plan: Hemoglobin A1c  Screen for colon cancer - Plan: Ambulatory referral to Gastroenterology  Chronic, stable. Cont Norvasc 10 mg/d, losartan 100 mg/d. Counseled on diet and exercise. Chronic, stable. Cont Lipitor 40 mg/d.  On work screening. Will ck A1c.  Refer to GI, unsure if they contacted him from previous referral.  F/u in 6 mo. The patient voiced understanding and agreement to the plan.  [place], DO 10/28/21  7:50 AM

## 2021-10-28 NOTE — Patient Instructions (Signed)
Give us 2-3 business days to get the results of your labs back.   Keep the diet clean and stay active.  Let us know if you need anything. 

## 2021-11-02 ENCOUNTER — Other Ambulatory Visit (INDEPENDENT_AMBULATORY_CARE_PROVIDER_SITE_OTHER): Payer: BC Managed Care – PPO

## 2021-11-02 DIAGNOSIS — E782 Mixed hyperlipidemia: Secondary | ICD-10-CM

## 2021-11-02 DIAGNOSIS — R739 Hyperglycemia, unspecified: Secondary | ICD-10-CM | POA: Diagnosis not present

## 2021-11-02 LAB — COMPREHENSIVE METABOLIC PANEL
ALT: 53 U/L (ref 0–53)
AST: 23 U/L (ref 0–37)
Albumin: 4.3 g/dL (ref 3.5–5.2)
Alkaline Phosphatase: 65 U/L (ref 39–117)
BUN: 18 mg/dL (ref 6–23)
CO2: 30 mEq/L (ref 19–32)
Calcium: 8.5 mg/dL (ref 8.4–10.5)
Chloride: 101 mEq/L (ref 96–112)
Creatinine, Ser: 0.97 mg/dL (ref 0.40–1.50)
GFR: 91.76 mL/min (ref >60.00)
Glucose, Bld: 105 mg/dL — ABNORMAL HIGH (ref 70–99)
Potassium: 3.3 mEq/L — ABNORMAL LOW (ref 3.5–5.1)
Sodium: 139 mEq/L (ref 135–145)
Total Bilirubin: 0.5 mg/dL (ref 0.2–1.2)
Total Protein: 6.7 g/dL (ref 6.0–8.3)

## 2021-11-02 LAB — LIPID PANEL
Cholesterol: 148 mg/dL (ref 0–200)
HDL: 28.5 mg/dL — ABNORMAL LOW (ref >39.00)
NonHDL: 119.46
Total CHOL/HDL Ratio: 5
Triglycerides: 277 mg/dL — ABNORMAL HIGH (ref 0.0–149.0)
VLDL: 55.4 mg/dL — ABNORMAL HIGH (ref 0.0–40.0)

## 2021-11-02 LAB — LDL CHOLESTEROL, DIRECT: Direct LDL: 77 mg/dL

## 2021-11-02 LAB — HEMOGLOBIN A1C: Hgb A1c MFr Bld: 5.7 % (ref 4.6–6.5)

## 2021-12-22 ENCOUNTER — Other Ambulatory Visit: Payer: Self-pay | Admitting: Family Medicine

## 2021-12-22 DIAGNOSIS — I10 Essential (primary) hypertension: Secondary | ICD-10-CM

## 2022-04-08 ENCOUNTER — Other Ambulatory Visit: Payer: Self-pay | Admitting: Family Medicine

## 2022-04-08 DIAGNOSIS — I10 Essential (primary) hypertension: Secondary | ICD-10-CM

## 2022-05-03 ENCOUNTER — Ambulatory Visit (INDEPENDENT_AMBULATORY_CARE_PROVIDER_SITE_OTHER): Payer: No Typology Code available for payment source | Admitting: Family Medicine

## 2022-05-03 ENCOUNTER — Encounter: Payer: Self-pay | Admitting: Family Medicine

## 2022-05-03 VITALS — BP 122/82 | HR 86 | Temp 98.1°F | Ht 70.0 in | Wt 246.0 lb

## 2022-05-03 DIAGNOSIS — E782 Mixed hyperlipidemia: Secondary | ICD-10-CM

## 2022-05-03 DIAGNOSIS — I10 Essential (primary) hypertension: Secondary | ICD-10-CM

## 2022-05-03 MED ORDER — ATORVASTATIN CALCIUM 40 MG PO TABS: 40.0000 mg | ORAL_TABLET | Freq: Every day | ORAL | 3 refills | Status: DC

## 2022-05-03 NOTE — Patient Instructions (Addendum)
Keep the diet clean and stay active.  Please contact the GI team at: [phone]  Give [place] 2-3 business days to get the results of your labs back.   Let us know if you need anything.

## 2022-05-03 NOTE — Progress Notes (Signed)
Chief Complaint  Patient presents with   Follow-up    Subjective Dale Hodges is a 52 y.o. male who presents for hypertension follow up. He does monitor home blood pressures. Blood pressures ranging from 120's/70's on average. He is compliant with medications- Norvasc 10 mg/d, losartan 100 mg/d. Patient has these side effects of medication: none He is usually adhering to a healthy diet overall. Current exercise: cycling No CP or SOB.      Hyperlipidemia Patient presents for dyslipidemia follow up. Currently being treated with Lipitor 40 mg/d and compliance with treatment thus far has been good. He denies myalgias. Diet/exercise as above.  The patient is not known to have coexisting coronary artery disease.   Past Medical History:  Diagnosis Date   Bipolar 1 disorder ([place])    Branch retinal vein occlusion    right eye   Diverticulitis    History of chicken pox    Hypertension    Mixed hyperlipidemia    Prediabetes     Exam BP 122/82 (BP Location: Right Arm, Patient Position: Sitting, Cuff Size: Normal)   Pulse 86   Temp 98.1 F (36.7 C) (Oral)   Ht 5\' 10"  (1.778 m)   Wt 246 lb (111.6 kg)   SpO2 98%   BMI 35.30 kg/m  General:  well developed, well nourished, in no apparent distress Heart: RRR, no bruits, 2+ pitting b/l LE edema tapering at knees Lungs: clear to auscultation, no accessory muscle use Psych: well oriented with normal range of affect and appropriate judgment/insight  Essential hypertension  Mixed hyperlipidemia - Plan: Lipid panel  Chronic, stable. Cont Norvasc 10 mg/d, losartan 100 mg/d. Counseled on diet and exercise. Chronic, hopefully stable. Cont Lipitor 40 mg/d. Will have him return for fasting labs.  [facility] info provided today for [facility].  F/u in 6 mo for CPE or prn. The patient voiced understanding and agreement to the plan.  [place], DO 05/03/22  8:04 AM

## 2022-05-10 ENCOUNTER — Other Ambulatory Visit (INDEPENDENT_AMBULATORY_CARE_PROVIDER_SITE_OTHER): Payer: No Typology Code available for payment source

## 2022-05-10 DIAGNOSIS — E782 Mixed hyperlipidemia: Secondary | ICD-10-CM | POA: Diagnosis not present

## 2022-05-10 LAB — LIPID PANEL
Cholesterol: 162 mg/dL (ref 0–200)
HDL: 29.2 mg/dL — ABNORMAL LOW (ref >39.00)
LDL Cholesterol: 97 mg/dL (ref 0–99)
NonHDL: 132.98
Total CHOL/HDL Ratio: 6
Triglycerides: 181 mg/dL — ABNORMAL HIGH (ref 0.0–149.0)
VLDL: 36.2 mg/dL (ref 0.0–40.0)

## 2022-05-16 ENCOUNTER — Encounter: Payer: Self-pay | Admitting: Family Medicine

## 2022-05-16 ENCOUNTER — Other Ambulatory Visit: Payer: Self-pay | Admitting: Family Medicine

## 2022-05-16 MED ORDER — ATORVASTATIN CALCIUM 80 MG PO TABS: 80.0000 mg | ORAL_TABLET | Freq: Every day | ORAL | 3 refills | Status: DC

## 2022-06-04 ENCOUNTER — Ambulatory Visit (HOSPITAL_COMMUNITY)
Admission: EM | Admit: 2022-06-04 | Discharge: 2022-06-04 | Disposition: A | Discharge status: Home / Self Care | Payer: No Typology Code available for payment source | Attending: Family | Admitting: Family

## 2022-06-04 DIAGNOSIS — F5102 Adjustment insomnia: Secondary | ICD-10-CM | POA: Diagnosis not present

## 2022-06-04 DIAGNOSIS — F3170 Bipolar disorder, currently in remission, most recent episode unspecified: Secondary | ICD-10-CM | POA: Diagnosis not present

## 2022-06-04 MED ORDER — QUETIAPINE FUMARATE 50 MG PO TABS: 50.0000 mg | ORAL_TABLET | Freq: Every evening | ORAL | 0 refills | Status: DC | PRN

## 2022-06-04 MED ORDER — QUETIAPINE FUMARATE 50 MG PO TABS: 50.0000 mg | ORAL_TABLET | Freq: Every evening | ORAL | Status: DC | PRN

## 2022-06-04 NOTE — Progress Notes (Signed)
   06/04/22 0839  [place] (Walk-ins at [facility] only)  How Did You Hear About [place]? Family/Friend  What Is the Reason for Your Visit/Call Today? 52 year old male present to [facility] accompanied by his wife. Patient report he's having lucid dreams. He reports suffering with grief due to the passing of his brother (Jan. 2023 via suicide), mother, and father (2015). Patient has been off his medication for Bipolar for 15 years. Denied SI/HI and psychosis.  How Long Has This Been Causing You Problems? 1 wk - 1 month  Have You Recently Had Any Thoughts About Hurting Yourself? No  Are You Planning to Commit Suicide/Harm Yourself At This time? No  Have you Recently Had Thoughts About [place]? No  Are You Planning To Harm Someone At This Time? No  Are you currently experiencing any auditory, visual or other hallucinations? No  Have You Used Any Alcohol or Drugs in the Past 24 Hours? No  Do you have any current medical co-morbidities that require immediate attention? Yes  Please describe current medical co-morbidities that require immediate attention: high blool pressure  Clinician description of patient physical appearance/behavior: Patient dressed appropiately for the weather. He was cooperative during the assessment. He became tearful as he discussed the deaths of his brother, father and mother.  What Do You Feel Would Help You the Most Today? Medication(s);Stress Management  If access to [facility] was not available, would you have sought care in the Emergency Department? Yes  Determination of Need Routine (7 days)  Options For Referral Medication Management;Outpatient Therapy

## 2022-06-04 NOTE — ED Notes (Signed)
Patient discharged by provider Ricky Ala, NP with written and verbal instructions.

## 2022-06-04 NOTE — Discharge Instructions (Signed)

## 2022-06-04 NOTE — ED Provider Notes (Addendum)
Behavioral Health Urgent Care Medical Screening Exam  Patient Name: Dale Hodges MRN: [id number] Date of Evaluation: 06/04/22 Chief Complaint:  " I am having Lucid dreams again." Diagnosis:  Final diagnoses:  Adjustment insomnia  Bipolar disorder in partial remission, most recent episode unspecified type ([place])    History of Present illness: Dale Hodges is a 52 y.o. male.  Presents to [facility] urgent care accompanied with his wife.  He reports he is having lucid dreams and it has become distressing to his wife.  He reports a history of bipolar 1 disorder.  States he was diagnosed in college.  States then he had another episode in his 55s  where he went inpatient. However,  has not followed up since then.  Denied that he is followed by therapy and/or psychiatry currently.    Denies auditory visual hallucinations. States he was prescribed lithium roughly ten years ago however, had stopped taking the medication since then. States he decided to restart taking the 50 year old lithium two days ago due to distressing symptoms.  He reports he has not been sleeping for the past few nights.  He denied illicit drug use or substance abuse history.  Reports symptoms of worry related to contentious arguments between he and his wife.  States they have a 63 year old daughter.  Denies that he is currently employed.  Does report the anniversary of the passing of his mother, father and brother.  States his mother passed away 10 years ago.  States his father passed away 3 years ago  and this is the anniversary roughly around the same time of year. Rated is depression 4/10 stated only feeling sad when thinking about the passing of his family members. Stated some concerns related to his wife is "talking about putting me out if I don't get help."   Dale Hodges is sitting in no acute distress. he is alert/oriented x 4; calm/cooperative; and mood congruent with affect. he is speaking in a  clear tone at moderate volume, and normal pace; with good eye contact. his thought process is coherent and relevant; There is no indication that he is currently responding to internal/external stimuli or experiencing delusional thought content; and he has denied suicidal/self-harm/homicidal ideation, psychosis, and paranoia.   Patient has remained calm throughout assessment and has answered questions appropriately.  Case was staffed with MD  [place]. Will make Seroquel 50 mg nightly available.  He reported he has taken Seroquel in the past and it as been too sedating with a groggy hangover feeling."     Dale Hodges is educated and verbalizes understanding of mental health resources and other crisis services in the community. he is instructed to call 911 and present to the nearest emergency room should he experience any suicidal/homicidal ideation, auditory/visual/hallucinations, or detrimental worsening of his mental health condition.  he was a also advised by Probation officer that he could call the toll-free phone on insurance card to assist with identifying in network counselors and agencies or number on back of Medicaid card to speak with care coordinator.    Psychiatric Specialty Exam  Presentation  General Appearance:Appropriate for Environment  Eye Contact:Good  Speech:Clear and Coherent  Speech Volume:Normal  Handedness:Right   Mood and Affect  Mood:Depressed; Anxious  Affect:Congruent   Thought Process  Thought Processes:Coherent  Descriptions of Associations:Intact  Orientation:Full (Time, Place and Person)  Thought Content:Logical    Hallucinations:None  Ideas of Reference:None  Suicidal Thoughts:No  Homicidal Thoughts:No   Sensorium  Memory:Immediate Good; Recent Good;  Remote Good  Judgment:Fair  Insight:Good   Executive Functions  Concentration:Good  Attention Span:Good  [place]   Psychomotor  Activity  Psychomotor Activity:Normal   Assets  Assets:Desire for Improvement   Sleep  Sleep:Fair  Number of hours: unknown  Physical Exam: Physical Exam Vitals and nursing note reviewed.  Constitutional:      Appearance: Normal appearance.  Cardiovascular:     Rate and Rhythm: Normal rate and regular rhythm.  Pulmonary:     Effort: Pulmonary effort is normal.     Breath sounds: Normal breath sounds.  Abdominal:     General: Abdomen is flat.  Neurological:     Mental Status: He is alert and oriented to person, place, and time.  Psychiatric:        Mood and Affect: Mood normal.        Behavior: Behavior normal.    Review of Systems  Psychiatric/Behavioral:  Negative for depression (reproted mild sympotms related to depression), hallucinations and suicidal ideas. The patient is nervous/anxious and has insomnia.   All other systems reviewed and are negative.  Blood pressure (!) 140/80, pulse (!) 105, temperature 97.9 F (36.6 C), temperature source Oral, resp. rate 20, SpO2 97 %. There is no height or weight on file to calculate BMI.  Musculoskeletal: Strength & Muscle Tone: within normal limits Gait & Station: normal Patient leans: N/A   [place] [facility] Discharge Disposition for Follow up and Recommendations: Based on my evaluation the patient does not appear to have an emergency medical condition and can be discharged with resources and follow up care in outpatient services for Medication Management, Partial Hospitalization Program, and Individual Therapy  Patient was provided with Seroquel 50 mg po QHS PRN may repeat X1.    [place], NP 06/04/2022, 9:51 AM

## 2022-06-05 ENCOUNTER — Other Ambulatory Visit: Payer: Self-pay | Admitting: Family Medicine

## 2022-06-05 DIAGNOSIS — F319 Bipolar disorder, unspecified: Secondary | ICD-10-CM

## 2022-06-28 ENCOUNTER — Other Ambulatory Visit: Payer: Self-pay | Admitting: Family Medicine

## 2022-06-28 DIAGNOSIS — I10 Essential (primary) hypertension: Secondary | ICD-10-CM

## 2022-06-29 ENCOUNTER — Ambulatory Visit (HOSPITAL_COMMUNITY)
Payer: No Typology Code available for payment source | Admitting: Student in an Organized Health Care Education/Training Program

## 2022-07-05 ENCOUNTER — Encounter: Payer: Self-pay | Admitting: Family Medicine

## 2022-07-05 MED ORDER — ATORVASTATIN CALCIUM 80 MG PO TABS: 80.0000 mg | ORAL_TABLET | Freq: Every day | ORAL | 3 refills | Status: DC

## 2022-07-06 ENCOUNTER — Ambulatory Visit (HOSPITAL_BASED_OUTPATIENT_CLINIC_OR_DEPARTMENT_OTHER)
Payer: No Typology Code available for payment source | Admitting: Student in an Organized Health Care Education/Training Program

## 2022-07-06 ENCOUNTER — Encounter (HOSPITAL_COMMUNITY): Payer: Self-pay | Admitting: Student in an Organized Health Care Education/Training Program

## 2022-07-06 VITALS — BP 130/82 | HR 83 | Ht 70.0 in | Wt 248.6 lb

## 2022-07-06 DIAGNOSIS — F319 Bipolar disorder, unspecified: Secondary | ICD-10-CM | POA: Diagnosis not present

## 2022-07-06 MED ORDER — QUETIAPINE FUMARATE 100 MG PO TABS: 100.0000 mg | ORAL_TABLET | Freq: Every evening | ORAL | 1 refills | Status: DC | PRN

## 2022-07-06 NOTE — Progress Notes (Addendum)
Psychiatric Initial Adult Assessment   Patient Identification: Dale Hodges Date of Evaluation:  07/06/2022 Referral Source: [facility] Chief Complaint:   Chief Complaint  Patient presents with   Establish Care   Bipolar Disorder, Depressed   Visit Diagnosis:    ICD-10-CM   1. Bipolar affective disorder, remission status unspecified (HCC)  F31.9 QUEtiapine (SEROQUEL) 100 MG tablet      History of Present Illness:   Dale Hodges is a 52 yr old male who presents to [facility] and for Medication Management.  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  He reports that in February that was his first "episode" he had had in almost 10 yrs.  He reports that he was having issues sleeping and had run out of his OTC sleep aid.  He then took a Delta 8 gummi and things started going bad from there.  He reports that he was not able to sleep and he began having racing thoughts and thoughts that he was an angel and was grandiose.  He reports that it scared his wife and so she brought him to the [facility].  He was started on Seroquel at [facility] and reports that it has done well for him and he did well on it in the past.  He reports his symptoms first started while in college but reports there was THC use and "bad" acid trips.  He reports several stressors have happened in the past few years.  He reports his father was diagnosed with Pancreatic Cancer in 07-31-13 and died within about a year.  He reports his mother died from a blood clot about 4 yrs ago.  His brother completed suicide 04/2021 by gunshot to the head.  He reports that being the last member of his immediate family does weigh on him.  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  He reports no  history of Suicide Attempts.  He reports no history of Self Injurious Behavior.  He reports 4 previous Psychiatric Hospitalizations (last [facility] 03/2010).  He reports a past medical history significant for HTN, Sleep Apnea (does wear CPAP every night), and Hypercholesterolemia.  He reports past surgical history is significant for colon resection.  He reports no history of head trauma or Seizures.  He reports allergies to Codeine- sleep walking.  He reports he lives in a house with his wife and 67 yr old daughter, his 49 yr old daughter is currently in school at [employer].  He reports he currently works at [employer] in [place] [employer] which is stressful but was recently promoted.   He reports he graduated [employer].  He reports he has his Bachelor's in Social Work.  He reports rare EtOH use.  He reports Vaping.  He reports no substance use.  He reports no current legal issues.  He reports no access to firearms.   Discussed with him that we could continue with Seroquel or trial Abilify.  Discussed potential risks and benefits of both medications and after discussion, considering he has significant issues with sleep, he choose to continue with the Seroquel.  Discussed that we would increase his Seroquel at this time.  Discussed that we could consider starting an antidepressant at follow up.  Discussed that he needs to contact his PCP to obtain a new EKG  for routine monitoring as his last one was 5 yrs ago and he reported understanding.  Discussed with him what to do in the event of a future crisis.  Discussed that he can go to [facility], go to the nearest ED, or call 911 or [phone].   He reported understanding and had no concerns.  He reports no SI, HI, or AVH.  He reports his sleep is good.  He reports his appetite is good.  He reports some congestion but otherwise reports no other concerns at present.  He will return for follow up in approximately 4-6 weeks.   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  Previous Psychotropic Medications: Yes  Lithium, Seroquel, Risperdal  Substance Abuse History in the last 12 months:  No.  Consequences of Substance Abuse: NA  Past Medical History:  Past Medical History:  Diagnosis Date   Bipolar 1 disorder ([address])    Branch retinal vein occlusion    right eye   Diverticulitis    History of chicken pox    Hypertension    Mixed hyperlipidemia    Prediabetes     Past Surgical History:  Procedure Laterality Date   ABDOMINAL SURGERY     COLON RESECTION     COLOSTOMY REVERSAL      Family Psychiatric History: Father- Suicide Attempt after Cancer Diagnosis Brother- possible Bipolar Disorder, EtOH Abuse, Completed Suicide via gunshot to the head 04/2021 Paternal and Maternal Grandfather- EtOH Abuse  Family History:  Family History  Problem Relation Age of Onset   Hypertension Mother    Cancer Sister    Diabetes Sister    Hypertension Brother     Social History:   Social History   Socioeconomic History   Marital status: Married    Spouse name: Not on file   Number of children: Not on file   Years of education: Not on file   Highest education level: Not on file  Occupational History   Not on file  Tobacco Use   Smoking status: Former    Types: Cigarettes    Quit date: 09/06/1998    Years since quitting: 23.8   Smokeless tobacco: Never   Tobacco comments:    states he was a social smoker, currently Vapes  Vaping Use   Vaping Use: Never used  Substance and Sexual  Activity   Alcohol use: Yes    Comment: occasional   Drug use: No   Sexual activity: Not on file  Other Topics Concern   Not on file  Social History Narrative   Not on file   Social Determinants of Health   Financial Resource Strain: Not on file  Food Insecurity: Not on file  Transportation Needs: Not on file  Physical Activity: Not on file  Stress: Not on file  Social Connections: Not on file    Additional Social History: None  Allergies:   Allergies  Allergen Reactions   Codeine     Triggers manic episode    Metabolic Disorder Labs: Lab Results  Component Value Date   HGBA1C 5.7 11/02/2021   MPG 108 02/13/2020   No  results found for: "PROLACTIN" Lab Results  Component Value Date   CHOL 162 05/10/2022   TRIG 181.0 (H) 05/10/2022   HDL 29.20 (L) 05/10/2022   CHOLHDL 6 05/10/2022   VLDL 36.2 05/10/2022   LDLCALC 97 05/10/2022   LDLCALC 98 04/29/2021   No results found for: "TSH"  Therapeutic Level Labs: Lab Results  Component Value Date   LITHIUM <0.25 (L) 04/11/2010   No results found for: "CBMZ" Lab Results  Component Value Date   VALPROATE 41.6 (L) 04/11/2010    Current Medications: Current Outpatient Medications  Medication Sig Dispense Refill   amLODipine (NORVASC) 10 MG tablet TAKE 1 TABLET(10 MG) BY MOUTH DAILY 90 tablet 0   atorvastatin (LIPITOR) 80 MG tablet Take 1 tablet (80 mg total) by mouth daily. 90 tablet 3   losartan (COZAAR) 100 MG tablet TAKE 1 TABLET(100 MG) BY MOUTH DAILY 90 tablet 3   QUEtiapine (SEROQUEL) 100 MG tablet Take 1 tablet (100 mg total) by mouth at bedtime and may repeat dose one time if needed. 30 tablet 1   No current facility-administered medications for this visit.    Musculoskeletal: Strength & Muscle Tone: within normal limits Gait & Station: normal Patient leans: N/A  Psychiatric Specialty Exam: Review of Systems  HENT:  Positive for congestion.   Respiratory:  Negative for cough and shortness of  breath.   Cardiovascular:  Negative for chest pain.  Gastrointestinal:  Negative for abdominal pain, constipation, diarrhea, nausea and vomiting.  Neurological:  Negative for weakness and headaches.  Psychiatric/Behavioral:  Positive for dysphoric mood. Negative for hallucinations, sleep disturbance and suicidal ideas. The patient is not nervous/anxious.     Blood pressure 130/82, pulse 83, height 5\' 10"  (1.778 m), weight 248 lb 9.6 oz (112.8 kg), SpO2 96 %.Body mass index is 35.67 kg/m.  General Appearance: Casual and Fairly Groomed  Eye Contact:  Good  Speech:  Clear and Coherent and Normal Rate  Volume:  Normal  Mood:  Dysphoric  Affect:  Congruent  Thought Process:  Coherent and Goal Directed  Orientation:  Full (Time, Place, and Person)  Thought Content:  WDL and Logical  Suicidal Thoughts:  No  Homicidal Thoughts:  No  Memory:  Immediate;   Good Recent;   Good  Judgement:  Good  Insight:  Good  Psychomotor Activity:  Normal  Concentration:  Concentration: Good and Attention Span: Good  Recall:  Good  Fund of Knowledge:Good  Language: Good  Akathisia:  Negative  Handed:  Left  AIMS (if indicated):  done AIMS= 0  Assets:  Communication Skills Desire for Improvement Financial Resources/Insurance Housing Resilience Social Support Vocational/Educational  ADL's:  Intact  Cognition: WNL  Sleep:  Good   Screenings: [place] Office Visit from 07/06/2022 in [facility]  AIMS Total Score 0      GAD-7    [place] Office Visit from 07/06/2022 in [facility]  Total GAD-7 Score 1      PHQ2-9    [place] Office Visit from 07/06/2022 in [facility] Office Visit from 10/28/2021 in [facility] [place] Visit from 04/14/2021 in [facility] Primary [facility] at [place]  Visit from 10/08/2020 in [facility] at [facility]  PHQ-2 Total Score 2 0 0 0  PHQ-9 Total Score 6 3 -- --  Assessment and Plan:  Kahle Reaser. Belongia is a 52 yr old male who presents to [facility] and for Medication Management.  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   Schneider has many symptoms that that are consistent with Bipolar Disorder, however, this was always in the context of substance abuse so Substance Induced cannot be completely ruled out.  After discussing potential medication options he would like to continue with the Seroquel so we will increase it at this time.  We will not start an antidepressant at this time due to concerns of causing mania.  He will contact his PCP to get a new EKG as his last was ~5 yrs ago, but his blood work was done within the last year.  He will return for follow up in approximately 4-6 weeks.   Bipolar Disorder: -Increase Seroquel to 100 mg QHS for Mood stability, sleep, and augmentation.  30 tablets with 1 refill. -Consider starting antidepressant at follow up appointment    Collaboration of Care:   Patient/Guardian was advised Release of Information must be obtained prior to any record release in order to collaborate their care with an outside provider. Patient/Guardian was advised if they have not already done so to contact the registration department to sign all necessary forms in order for [place] to release information regarding their care.   Consent: Patient/Guardian gives verbal consent for treatment and assignment of benefits for services provided during this visit. Patient/Guardian expressed understanding and agreed to proceed.   Briant Cedar, MD 3/21/202411:58 AM

## 2022-08-17 ENCOUNTER — Ambulatory Visit (HOSPITAL_COMMUNITY)
Payer: No Typology Code available for payment source | Admitting: Student in an Organized Health Care Education/Training Program

## 2022-09-06 ENCOUNTER — Ambulatory Visit (HOSPITAL_COMMUNITY)
Payer: No Typology Code available for payment source | Admitting: Student in an Organized Health Care Education/Training Program

## 2022-09-13 ENCOUNTER — Ambulatory Visit (HOSPITAL_BASED_OUTPATIENT_CLINIC_OR_DEPARTMENT_OTHER)
Payer: No Typology Code available for payment source | Admitting: Student in an Organized Health Care Education/Training Program

## 2022-09-13 ENCOUNTER — Encounter (HOSPITAL_COMMUNITY): Payer: Self-pay | Admitting: Student in an Organized Health Care Education/Training Program

## 2022-09-13 DIAGNOSIS — F319 Bipolar disorder, unspecified: Secondary | ICD-10-CM | POA: Diagnosis not present

## 2022-09-13 MED ORDER — FLUOXETINE HCL 10 MG PO CAPS: 10.0000 mg | ORAL_CAPSULE | Freq: Every day | ORAL | 1 refills | Status: DC

## 2022-09-13 MED ORDER — QUETIAPINE FUMARATE 200 MG PO TABS: 100.0000 mg | ORAL_TABLET | Freq: Every day | ORAL | 1 refills | Status: DC

## 2022-09-13 NOTE — Addendum Note (Signed)
Addended by: Everlena Cooper on: 09/13/2022 09:59 AM   Modules accepted: Level of Service

## 2022-09-13 NOTE — Progress Notes (Signed)
BH MD/PA/NP OP Progress Note  09/13/2022 8:25 AM Dale Hodges  MRN:  [id number]  Chief Complaint:  Chief Complaint  Patient presents with   Follow-up   Bipolar Affective disorder   HPI:  Dale Hodges is a 52 yr old male who presents for Follow Up and Medication Management.  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  He reports that he has been doing better since his last appointment.  He reports that his sleep is much better and now he is only occasionally waking up maybe once at night.  He reports that his mood has been more stable but there is still some irritability present.  He reports he thinks this is due to fatigue because even though he is sleeping better he still has low energy/motivation.  Discussed with him that this is most likely from his depressive component.  Discussed trialing Prozac.  Discussed and risks and side effects and he was agreeable to this.  Discussed we would further increase his Seroquel to ensure mood stability and he reported understanding.  He reports no SI, HI, or AVH.  He reports sleep is good.  He reports appetite is doing good.  He reports no concerns present.  He will return for follow-up in approximately 6 weeks.  Discussed with patient that Resident Provider would be transitioning their care to another Resident Provider, Dr. Jerrel Ivory, starting July 2024.  He reported understanding and had no concerns.   Visit Diagnosis:    ICD-10-CM   1. 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  Past Medical History:  Past Medical History:  Diagnosis Date   Bipolar 1 disorder (HCC)    Branch retinal vein occlusion    right eye   Diverticulitis    History of chicken pox    Hypertension    Mixed hyperlipidemia    Prediabetes     Past Surgical History:  Procedure Laterality Date   ABDOMINAL SURGERY     COLON RESECTION     COLOSTOMY REVERSAL      Family Psychiatric History: Father- Suicide Attempt after Cancer Diagnosis Brother- possible Bipolar Disorder, EtOH Abuse, Completed Suicide via gunshot to the head 04/2021 Paternal and Maternal Grandfather- EtOH Abuse  Family History:  Family History  Problem Relation Age of Onset   Hypertension Mother    Cancer Sister    Diabetes Sister    Hypertension Brother     Social History:  Social History   Socioeconomic History   Marital status: Married    Spouse name: Not on file   Number of children: Not on file   Years of education: Not on file   Highest education level: Not on file  Occupational History   Not on file  Tobacco Use   Smoking status: Former    Types: Cigarettes    Quit date: 09/06/1998    Years since quitting: 24.0   Smokeless tobacco: Never   Tobacco comments:    states he was a social smoker, currently Vapes  Vaping Use   Vaping Use: Never used  Substance and Sexual Activity  Alcohol use: Yes    Comment: occasional   Drug use: No   Sexual activity: Not on file  Other Topics Concern   Not on file  Social History Narrative   Not on file   Social Determinants of Health   Financial Resource Strain: Not on file  Food Insecurity: Not on file  Transportation Needs: Not on file  Physical Activity: Not on file  Stress: Not on file  Social Connections: Not on file    Allergies:  Allergies  Allergen Reactions   Codeine     Triggers manic episode    Metabolic Disorder Labs: Lab Results  Component Value Date   HGBA1C 5.7 11/02/2021   MPG 108 02/13/2020   No  results found for: "PROLACTIN" Lab Results  Component Value Date   CHOL 162 05/10/2022   TRIG 181.0 (H) 05/10/2022   HDL 29.20 (L) 05/10/2022   CHOLHDL 6 05/10/2022   VLDL 36.2 05/10/2022   LDLCALC 97 05/10/2022   LDLCALC 98 04/29/2021   No results found for: "TSH"  Therapeutic Level Labs: Lab Results  Component Value Date   LITHIUM <0.25 (L) 04/11/2010   Lab Results  Component Value Date   VALPROATE 41.6 (L) 04/11/2010   No results found for: "CBMZ"  Current Medications: Current Outpatient Medications  Medication Sig Dispense Refill   FLUoxetine (PROZAC) 10 MG capsule Take 1 capsule (10 mg total) by mouth daily. 30 capsule 1   amLODipine (NORVASC) 10 MG tablet TAKE 1 TABLET(10 MG) BY MOUTH DAILY 90 tablet 0   atorvastatin (LIPITOR) 80 MG tablet Take 1 tablet (80 mg total) by mouth daily. 90 tablet 3   losartan (COZAAR) 100 MG tablet TAKE 1 TABLET(100 MG) BY MOUTH DAILY 90 tablet 3   QUEtiapine (SEROQUEL) 200 MG tablet Take 0.5 tablets (100 mg total) by mouth at bedtime. 30 tablet 1   No current facility-administered medications for this visit.     Musculoskeletal: Strength & Muscle Tone: within normal limits Gait & Station: normal Patient leans: N/A  Psychiatric Specialty Exam: Review of Systems  Respiratory:  Negative for cough and shortness of breath.   Cardiovascular:  Negative for chest pain.  Gastrointestinal:  Negative for abdominal pain, constipation, diarrhea, nausea and vomiting.  Neurological:  Negative for weakness and headaches.  Psychiatric/Behavioral:  Positive for dysphoric mood. Negative for hallucinations, sleep disturbance and suicidal ideas. The patient is not nervous/anxious.     Blood pressure 135/84, pulse 83, height 5\' 10"  (1.778 m), weight 251 lb (113.9 kg), SpO2 95 %.Body mass index is 36.01 kg/m.  General Appearance: Casual and Fairly Groomed  Eye Contact:  Good  Speech:  Clear and Coherent and Normal Rate  Volume:  Normal  Mood:   Dysphoric  Affect:  Congruent  Thought Process:  Coherent and Goal Directed  Orientation:  Full (Time, Place, and Person)  Thought Content: WDL and Logical   Suicidal Thoughts:  No  Homicidal Thoughts:  No  Memory:  Immediate;   Good Recent;   Good  Judgement:  Good  Insight:  Good  Psychomotor Activity:  Normal  Concentration:  Concentration: Good and Attention Span: Good  Recall:  Good  Fund of Knowledge: Good  Language: Good  Akathisia:  Negative  Handed:  Left  AIMS (if indicated): done AIMS= 0  Assets:  Communication Skills Desire for Improvement Financial Resources/Insurance Housing Physical Health Resilience Social Support Vocational/Educational  ADL's:  Intact  Cognition: WNL  Sleep:  Good   Screenings: AIMS  Flowsheet Row [facility] Office Visit from 07/06/2022 in [facility]  AIMS Total Score 0 0      GAD-7    Flowsheet Row Office Visit from 07/06/2022 in [facility]  Total GAD-7 Score 1      PHQ2-9    Flowsheet Row Office Visit from 07/06/2022 in [facility] Office Visit from 10/28/2021 in [facility] Office Visit from 04/14/2021 in [facility] at [facility] Office Visit from 10/08/2020 in [facility] at [facility]  PHQ-2 Total Score 2 0 0 0  PHQ-9 Total Score 6 3 -- --        Assessment and Plan:  Jackelyn Poling. Gambill is a 52 yr old male who presents for Follow Up and Medication Management.  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   Tyre has responded well to the increase in Seroquel.  He  continues to have issues with depression specifically in the form of lack of energy/motivation.  We will start Prozac at this time as well as further increase the Seroquel to ensure mood stability.  He will return for follow-up in approximately 6 weeks.   Bipolar Disorder: -Increase Seroquel 200 mg QHS for Mood stability, sleep, and augmentation.  30 tablets with 1 refill. -Start Prozac 10 mg daily for depression.  30 tablets with 1 refill.    Collaboration of Care:   Patient/Guardian was advised Release of Information must be obtained prior to any record release in order to collaborate their care with an outside provider. Patient/Guardian was advised if they have not already done so to contact the registration department to sign all necessary forms in order for [place] to release information regarding their care.   Consent: Patient/Guardian gives verbal consent for treatment and assignment of benefits for services provided during this visit. Patient/Guardian expressed understanding and agreed to proceed.    Lauro Franklin, MD 09/13/2022, 8:25 AM

## 2022-09-15 ENCOUNTER — Other Ambulatory Visit: Payer: Self-pay | Admitting: Family Medicine

## 2022-09-15 DIAGNOSIS — I10 Essential (primary) hypertension: Secondary | ICD-10-CM

## 2022-09-27 ENCOUNTER — Other Ambulatory Visit: Payer: Self-pay | Admitting: Family Medicine

## 2022-09-27 DIAGNOSIS — I10 Essential (primary) hypertension: Secondary | ICD-10-CM

## 2022-10-30 ENCOUNTER — Ambulatory Visit (HOSPITAL_COMMUNITY): Payer: No Typology Code available for payment source | Admitting: Student

## 2022-11-01 ENCOUNTER — Ambulatory Visit (INDEPENDENT_AMBULATORY_CARE_PROVIDER_SITE_OTHER): Payer: No Typology Code available for payment source | Admitting: Family Medicine

## 2022-11-01 ENCOUNTER — Encounter: Payer: Self-pay | Admitting: Family Medicine

## 2022-11-01 VITALS — BP 120/82 | HR 78 | Temp 99.0°F | Ht 70.0 in | Wt 252.1 lb

## 2022-11-01 DIAGNOSIS — Z1211 Encounter for screening for malignant neoplasm of colon: Secondary | ICD-10-CM

## 2022-11-01 DIAGNOSIS — Z0000 Encounter for general adult medical examination without abnormal findings: Secondary | ICD-10-CM | POA: Diagnosis not present

## 2022-11-01 NOTE — Patient Instructions (Addendum)
Give [place] 2-3 business days to get the results of your labs back.   Keep the diet clean and stay active.  If you do not hear anything about your referral in the next 1-2 weeks, call our office and ask for an update.  The Shingrix vaccine (for shingles) is a 2 shot series spaced 2-6 months apart. It can make people feel low energy, achy and almost like they have the flu for 48 hours after injection. 1/5 people can have nausea and/or vomiting. Please plan accordingly when deciding on when to get this shot. Call our office for a nurse visit appointment to get this. The second shot of the series is less severe regarding the side effects, but it still lasts 48 hours.   OK to use Debrox (peroxide) in the ear to loosen up wax. Also recommend using a bulb syringe (for removing boogers from baby's noses) to flush through warm water and vinegar (3-4:1 ratio). An alternative, though more expensive, is an elephant ear washer wax removal kit. Do not use Q-tips as this can impact wax further.  Let us know if you need anything.

## 2022-11-01 NOTE — Progress Notes (Signed)
Chief Complaint  Patient presents with   Annual Exam    Well Male Dale Hodges is here for a complete physical.   His last physical was >1 year ago.  Current diet: in general, a "pretty good" diet.  Current exercise: cycling, swimming Weight trend: stable Fatigue out of ordinary? No. Seat belt? Yes.   Advanced directive? No  Health maintenance Shingrix- No Colonoscopy- No Tetanus- Yes HIV- Yes Hep C- Yes   Past Medical History:  Diagnosis Date   Bipolar 1 disorder (HCC)    Branch retinal vein occlusion    right eye   Diverticulitis    History of chicken pox    Hypertension    Mixed hyperlipidemia    Prediabetes       Past Surgical History:  Procedure Laterality Date   ABDOMINAL SURGERY     COLON RESECTION     COLOSTOMY REVERSAL      Medications  Current Outpatient Medications on File Prior to Visit  Medication Sig Dispense Refill   amLODipine (NORVASC) 10 MG tablet TAKE 1 TABLET(10 MG) BY MOUTH DAILY 90 tablet 0   atorvastatin (LIPITOR) 80 MG tablet Take 1 tablet (80 mg total) by mouth daily. 90 tablet 3   FLUoxetine (PROZAC) 10 MG capsule Take 1 capsule (10 mg total) by mouth daily. 30 capsule 1   losartan (COZAAR) 100 MG tablet TAKE 1 TABLET(100 MG) BY MOUTH DAILY 90 tablet 3   QUEtiapine (SEROQUEL) 200 MG tablet Take 0.5 tablets (100 mg total) by mouth at bedtime. 30 tablet 1   Allergies Allergies  Allergen Reactions   Codeine     Triggers manic episode    Family History Family History  Problem Relation Age of Onset   Hypertension Mother    Cancer Sister    Diabetes Sister    Hypertension Brother     Review of Systems: Constitutional:  no fevers Eye:  no recent significant change in vision Ear/Nose/Mouth/Throat:  Ears:  no hearing loss Nose/Mouth/Throat:  no complaints of nasal congestion, no sore throat Cardiovascular:  no chest pain Respiratory:  no shortness of breath Gastrointestinal:  no change in bowel habits GU:  Male:  negative for dysuria, frequency Musculoskeletal/Extremities:  no joint pain Integumentary (Skin/Breast):  no abnormal skin lesions reported Neurologic:  no headaches Endocrine: No unexpected weight changes Hematologic/Lymphatic:  no abnormal bleeding  Exam BP 120/82 (BP Location: Left Arm, Patient Position: Sitting, Cuff Size: Large)   Pulse 78   Temp 99 F (37.2 C) (Oral)   Ht 5\' 10"  (1.778 m)   Wt 252 lb 2 oz (114.4 kg)   SpO2 98%   BMI 36.18 kg/m  General:  well developed, well nourished, in no apparent distress Skin:  no significant moles, warts, or growths Head:  no masses, lesions, or tenderness Eyes:  pupils equal and round, sclera anicteric without injection Ears:  canals without lesions, TMs shiny without retraction, no obvious effusion, no erythema Nose:  nares patent, mucosa normal Throat/Pharynx:  lips and gingiva without lesion; tongue and uvula midline; non-inflamed pharynx; no exudates or postnasal drainage Neck: neck supple without adenopathy, thyromegaly, or masses Cardiac: RRR, no bruits, no LE edema Lungs:  clear to auscultation, breath sounds equal bilaterally, no respiratory distress Abdomen: BS+, soft, non-tender, non-distended, no masses or organomegaly noted Rectal: Deferred Musculoskeletal:  symmetrical muscle groups noted without atrophy or deformity Neuro:  gait normal; deep tendon reflexes normal and symmetric Psych: well oriented with normal range of affect and appropriate judgment/insight  Assessment and Plan  Well adult exam - Plan: CBC, Comprehensive metabolic panel, Lipid panel  Screen for colon cancer - Plan: Ambulatory referral to Gastroenterology   Well 52 y.o. male. Counseled on diet and exercise. Counseled on risks and benefits of prostate cancer screening with PSA. The patient agrees to forego testing. Advanced directive form provided today.  Shingrix rec'd.  CCS: Have referred him several times for CCS, will refer again. Emphasized  importance of having this done.  Immunizations, labs, and further orders as above. Follow up in 6 mo or prn. The patient voiced understanding and agreement to the plan.  Jilda Roche [place], DO 11/01/22 7:47 AM

## 2022-11-06 ENCOUNTER — Ambulatory Visit (HOSPITAL_BASED_OUTPATIENT_CLINIC_OR_DEPARTMENT_OTHER): Payer: No Typology Code available for payment source | Admitting: Student

## 2022-11-06 ENCOUNTER — Encounter (HOSPITAL_COMMUNITY): Payer: Self-pay | Admitting: Student

## 2022-11-06 VITALS — BP 142/87 | HR 76 | Wt 250.0 lb

## 2022-11-06 DIAGNOSIS — F319 Bipolar disorder, unspecified: Secondary | ICD-10-CM | POA: Diagnosis not present

## 2022-11-06 MED ORDER — QUETIAPINE FUMARATE 200 MG PO TABS
200.0000 mg | ORAL_TABLET | Freq: Every day | ORAL | 2 refills | Status: DC
Start: 2022-11-06 — End: 2023-01-08

## 2022-11-06 MED ORDER — FLUOXETINE HCL 10 MG PO CAPS
10.0000 mg | ORAL_CAPSULE | Freq: Every day | ORAL | 1 refills | Status: DC
Start: 2022-11-06 — End: 2023-01-08

## 2022-11-06 NOTE — Addendum Note (Signed)
Addended by: Everlena Cooper on: 11/06/2022 01:37 PM   Modules accepted: Level of Service

## 2022-11-06 NOTE — Progress Notes (Addendum)
BH MD Outpatient Progress Note  11/06/2022 9:06 AM Dale Hodges  MRN:  [id number]  Assessment:  Dale Hodges presents for follow-up evaluation in-person. Today, 11/06/22, patient reports significant benefit with the addition of Prozac and says that he is doing well with respect to depression and hypomanic symptoms.  He does report a small increase in anxiety but says that this is marginal compared to the benefit he is receiving from the medication.  He is noted to have appropriate affect with a linear and logical thought process and fair insight.  Identifying Information: Dale Hodges is a 52 y.o. male with a history of bipolar affective disorder 1 who is an established patient with [facility] for management of depressive and hypomanic episodes.  The patient was last psychiatrically hospitalized in 2011 for mania.  The patient reports that previous manic episodes have involved decreased need for sleep, hyperreligious ideation and statements, racing thoughts, and disorganized behavior (during his first manic episode he walked into a stranger's house, resulting in his first psychiatric hospitalization).   Plan:  # Bipolar affective disorder 1 Interventions: - Increase Seroquel from 100 mg nightly to 200 mg nightly, in order to achieve a more effective dose for mood stabilization.  (It appears that there was a mistake in the prescription of the 200 mg dose at the last visit and as a result, the patient has only been taking 100 mg nightly). -- Continue Prozac at the present dose of 10 mg daily - I have discussed the need to watch for the emergence of hypomanic/manic symptoms while taking this medication and the patient expresses understanding  #Long term use of antipsychotic medication -- Lipid panel from January 2024, LDL 97 - A1c from July 2023, 5.7 - Sent a message to the patient's PCP to obtain repeat labs and EKG   Patient was given contact  information for behavioral health clinic and was instructed to call 911 for emergencies.   Subjective:  Chief Complaint:  Chief Complaint  Patient presents with   Follow-up    Interval History:  The patient reports he is doing well.  He reports consistent, restful sleep of appropriate duration, normal appetite, mild feelings of depression that come and go.  He denies experiencing any hopelessness or suicidal thoughts.  He feels that his depression has been improved with the addition of Prozac.  Patient also feels positively about his Seroquel.  It appears there was a mistake in the prescription of this medication and the patient was not able to take the increased dose of 200 mg nightly.  The patient reports a slight increase in his anxiety, which he feels is due to the addition of the Prozac.  He feels the benefit to his mood greatly outweighs the detrimental effect on his anxiety.  I discussed with him that the side effect may go away.  We discussed his previous manic symptoms (as described above) and the patient feels strongly he is not experiencing any manic symptoms.  Discussed the need for him to monitor for this while taking Prozac.  The patient reports last experiencing a hypomanic episode approximately 2 months ago.  He states that this episode was mild and self-limited.  He feels that he missed approximately 2 nights of sleep and had racing thoughts that reminded him of prior episodes.  Pertinent social history was reviewed.  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  He reports stable and strong relationships with his wife of 22 years and  his 2 adolescent children.  He states that he plays in the band at church and finds this meaningful.  He states that he does not drink alcohol or use illegal drugs.  He does report vaping approximately 1 cartridge every week.  Visit Diagnosis:    ICD-10-CM   1. Bipolar I disorder (HCC)  F31.9       Past Psychiatric History: Four psychiatric  hospitalizations, the last occurring in 2011, no history of suicide attempt or self-injurious behavior per the report of the patient and review of the medical record.  Past Medical History:  Past Medical History:  Diagnosis Date   Bipolar 1 disorder (HCC)    Branch retinal vein occlusion    right eye   Diverticulitis    History of chicken pox    Hypertension    Mixed hyperlipidemia    Prediabetes     Past Surgical History:  Procedure Laterality Date   ABDOMINAL SURGERY     COLON RESECTION     COLOSTOMY REVERSAL      Family Psychiatric History: None pertinent  Family History:  Family History  Problem Relation Age of Onset   Hypertension Mother    Cancer Sister    Diabetes Sister    Hypertension Brother     Social History:  Social History   Socioeconomic History   Marital status: Married    Spouse name: Not on file   Number of children: Not on file   Years of education: Not on file   Highest education level: Not on file  Occupational History   Not on file  Tobacco Use   Smoking status: Former    Current packs/day: 0.00    Types: Cigarettes    Quit date: 09/06/1998    Years since quitting: 24.1   Smokeless tobacco: Never   Tobacco comments:    states he was a social smoker, currently Vapes  Vaping Use   Vaping status: Never Used  Substance and Sexual Activity   Alcohol use: Yes    Comment: occasional   Drug use: No   Sexual activity: Not on file  Other Topics Concern   Not on file  Social History Narrative   Not on file   Social Determinants of Health   Financial Resource Strain: Not on file  Food Insecurity: Not on file  Transportation Needs: Not on file  Physical Activity: Not on file  Stress: Not on file  Social Connections: Not on file    Allergies:  Allergies  Allergen Reactions   Codeine     Triggers manic episode    Current Medications: Current Outpatient Medications  Medication Sig Dispense Refill   amLODipine (NORVASC) 10 MG  tablet TAKE 1 TABLET(10 MG) BY MOUTH DAILY 90 tablet 0   atorvastatin (LIPITOR) 80 MG tablet Take 1 tablet (80 mg total) by mouth daily. 90 tablet 3   FLUoxetine (PROZAC) 10 MG capsule Take 1 capsule (10 mg total) by mouth daily. 30 capsule 1   losartan (COZAAR) 100 MG tablet TAKE 1 TABLET(100 MG) BY MOUTH DAILY 90 tablet 3   No current facility-administered medications for this visit.     Objective:  Psychiatric Specialty Exam: Physical Exam Constitutional:      Appearance: the patient is not toxic-appearing.  Pulmonary:     Effort: Pulmonary effort is normal.  Neurological:     General: No focal deficit present.     Mental Status: the patient is alert and oriented to person, place, and time.  Review of Systems  Respiratory:  Negative for shortness of breath.   Cardiovascular:  Negative for chest pain.  Gastrointestinal:  Negative for abdominal pain, constipation, diarrhea, nausea and vomiting.  Neurological:  Negative for headaches.      There were no vitals taken for this visit.  General Appearance: Fairly Groomed  Eye Contact:  Good  Speech:  Clear and Coherent  Volume:  Normal  Mood:  Euthymic  Affect:  Congruent  Thought Process:  Coherent  Orientation:  Full (Time, Place, and Person)  Thought Content: Logical   Suicidal Thoughts:  No  Homicidal Thoughts:  No  Memory:  Immediate;   Good  Judgement:  fair  Insight:  fair  Psychomotor Activity:  Normal  Concentration:  Concentration: Good  Recall:  Good  Fund of Knowledge: Good  Language: Good  Akathisia:  No  Handed:    AIMS (if indicated): not done  Assets:  Communication Skills Desire for Improvement Financial Resources/Insurance Housing Leisure Time Physical Health  ADL's:  Intact  Cognition: WNL  Sleep:  Fair     Metabolic Disorder Labs: Lab Results  Component Value Date   HGBA1C 5.7 11/02/2021   MPG 108 02/13/2020   No results found for: "PROLACTIN" Lab Results  Component Value Date    CHOL 162 05/10/2022   TRIG 181.0 (H) 05/10/2022   HDL 29.20 (L) 05/10/2022   CHOLHDL 6 05/10/2022   VLDL 36.2 05/10/2022   LDLCALC 97 05/10/2022   LDLCALC 98 04/29/2021   No results found for: "TSH"  Therapeutic Level Labs: Lab Results  Component Value Date   LITHIUM <0.25 (L) 04/11/2010   Lab Results  Component Value Date   VALPROATE 41.6 (L) 04/11/2010   No results found for: "CBMZ"  Screenings: AIMS    Flowsheet Row [facility] Office Visit from 07/06/2022 in [facility]  AIMS Total Score 0 0      GAD-7    Flowsheet Row Office Visit from 07/06/2022 in [facility]  Total GAD-7 Score 1      PHQ2-9    Flowsheet Row Office Visit from 11/01/2022 in [facility] Office Visit from 07/06/2022 in [facility] Office Visit from 10/28/2021 in [facility] at [facility] Office Visit from 04/14/2021 in [facility] at [facility] Office Visit from 10/08/2020 in [facility] at [facility]  PHQ-2 Total Score 0 2 0 0 0  PHQ-9 Total Score 0 6 3 -- --       Collaboration of Care: none  A total of 30 minutes was spent involved in face to face clinical care, chart review, documentation.   Carlyn Reichert, MD 11/06/2022, 9:06 AM

## 2022-11-07 ENCOUNTER — Other Ambulatory Visit (INDEPENDENT_AMBULATORY_CARE_PROVIDER_SITE_OTHER): Payer: No Typology Code available for payment source

## 2022-11-07 ENCOUNTER — Other Ambulatory Visit: Payer: Self-pay | Admitting: Family Medicine

## 2022-11-07 DIAGNOSIS — E876 Hypokalemia: Secondary | ICD-10-CM

## 2022-11-07 DIAGNOSIS — Z0000 Encounter for general adult medical examination without abnormal findings: Secondary | ICD-10-CM

## 2022-11-07 LAB — LIPID PANEL
Cholesterol: 125 mg/dL (ref 0–200)
HDL: 29.1 mg/dL — ABNORMAL LOW (ref >39.00)
LDL Cholesterol: 69 mg/dL (ref 0–99)
NonHDL: 96.12
Total CHOL/HDL Ratio: 4
Triglycerides: 137 mg/dL (ref 0.0–149.0)
VLDL: 27.4 mg/dL (ref 0.0–40.0)

## 2022-11-07 LAB — COMPREHENSIVE METABOLIC PANEL
ALT: 42 U/L (ref 0–53)
AST: 19 U/L (ref 0–37)
Albumin: 4.2 g/dL (ref 3.5–5.2)
Alkaline Phosphatase: 65 U/L (ref 39–117)
BUN: 15 mg/dL (ref 6–23)
CO2: 29 mEq/L (ref 19–32)
Calcium: 8.8 mg/dL (ref 8.4–10.5)
Chloride: 104 mEq/L (ref 96–112)
Creatinine, Ser: 1.03 mg/dL (ref 0.40–1.50)
GFR: 84.78 mL/min (ref >60.00)
Glucose, Bld: 104 mg/dL — ABNORMAL HIGH (ref 70–99)
Potassium: 3.4 mEq/L — ABNORMAL LOW (ref 3.5–5.1)
Sodium: 142 mEq/L (ref 135–145)
Total Bilirubin: 0.5 mg/dL (ref 0.2–1.2)
Total Protein: 6.6 g/dL (ref 6.0–8.3)

## 2022-11-07 LAB — CBC
HCT: 45 % (ref 39.0–52.0)
Hemoglobin: 14.8 g/dL (ref 13.0–17.0)
MCHC: 32.9 g/dL (ref 30.0–36.0)
MCV: 91.9 fl (ref 78.0–100.0)
Platelets: 179 K/uL (ref 150.0–400.0)
RBC: 4.89 Mil/uL (ref 4.22–5.81)
RDW: 13.9 % (ref 11.5–15.5)
WBC: 8.4 K/uL (ref 4.0–10.5)

## 2022-11-17 ENCOUNTER — Other Ambulatory Visit: Payer: Self-pay | Admitting: Family Medicine

## 2022-11-17 ENCOUNTER — Other Ambulatory Visit (INDEPENDENT_AMBULATORY_CARE_PROVIDER_SITE_OTHER): Payer: No Typology Code available for payment source

## 2022-11-17 DIAGNOSIS — E876 Hypokalemia: Secondary | ICD-10-CM

## 2022-11-17 LAB — BASIC METABOLIC PANEL
BUN: 17 mg/dL (ref 6–23)
CO2: 31 mEq/L (ref 19–32)
Calcium: 8.4 mg/dL (ref 8.4–10.5)
Chloride: 103 mEq/L (ref 96–112)
Creatinine, Ser: 1.07 mg/dL (ref 0.40–1.50)
GFR: 80.98 mL/min (ref >60.00)
Glucose, Bld: 109 mg/dL — ABNORMAL HIGH (ref 70–99)
Potassium: 3.3 mEq/L — ABNORMAL LOW (ref 3.5–5.1)
Sodium: 142 mEq/L (ref 135–145)

## 2022-11-20 NOTE — Addendum Note (Signed)
Addended by: Scharlene Gloss B on: 11/20/2022 09:04 AM   Modules accepted: Orders

## 2022-11-24 ENCOUNTER — Other Ambulatory Visit (INDEPENDENT_AMBULATORY_CARE_PROVIDER_SITE_OTHER): Payer: No Typology Code available for payment source

## 2022-11-24 DIAGNOSIS — E876 Hypokalemia: Secondary | ICD-10-CM

## 2022-11-24 LAB — BASIC METABOLIC PANEL
BUN: 15 mg/dL (ref 6–23)
CO2: 31 mEq/L (ref 19–32)
Calcium: 8.5 mg/dL (ref 8.4–10.5)
Chloride: 103 mEq/L (ref 96–112)
Creatinine, Ser: 0.97 mg/dL (ref 0.40–1.50)
GFR: 91.08 mL/min (ref >60.00)
Glucose, Bld: 97 mg/dL (ref 70–99)
Potassium: 3.8 mEq/L (ref 3.5–5.1)
Sodium: 141 mEq/L (ref 135–145)

## 2022-11-24 LAB — MAGNESIUM: Magnesium: 1.9 mg/dL (ref 1.5–2.5)

## 2022-12-05 ENCOUNTER — Encounter: Payer: Self-pay | Admitting: Internal Medicine

## 2022-12-21 ENCOUNTER — Other Ambulatory Visit: Payer: Self-pay | Admitting: Family Medicine

## 2022-12-21 DIAGNOSIS — I10 Essential (primary) hypertension: Secondary | ICD-10-CM

## 2023-01-03 ENCOUNTER — Ambulatory Visit (AMBULATORY_SURGERY_CENTER): Payer: No Typology Code available for payment source

## 2023-01-03 VITALS — Ht 70.0 in | Wt 245.0 lb

## 2023-01-03 DIAGNOSIS — Z1211 Encounter for screening for malignant neoplasm of colon: Secondary | ICD-10-CM

## 2023-01-03 NOTE — Progress Notes (Signed)

## 2023-01-07 NOTE — Progress Notes (Unsigned)
BH MD Outpatient Progress Note  01/09/2023 1:49 PM Dale Hodges  MRN:  [id number]  Assessment:  Dale Hodges presents for follow-up evaluation.  The patient reports that he is doing well with the increase in Seroquel from 100 to 200 mg made at the last appointment.  No change in diagnosis or medications at this time.  Identifying Information: Dale Hodges is a 52 y.o. male with a history of bipolar affective disorder 1 who is an established patient with [facility] for management of depressive and hypomanic episodes.  The patient was last psychiatrically hospitalized in 2011 for mania.  The patient reports that previous manic episodes have involved decreased need for sleep, hyperreligious ideation and statements, racing thoughts, and disorganized behavior (during his first manic episode he walked into a stranger's house, resulting in his first psychiatric hospitalization).   Plan:  # Bipolar affective disorder 1 Interventions: - Continue Seroquel 200 mg nightly -- Continue Prozac at the present dose of 10 mg daily  #Long term use of antipsychotic medication -- Lipid panel from July 2024, LDL 69 - A1c from July 2023, 5.7 primary care physician has agreed to update - Primary care physician has agreed to obtain EKG   Patient was given contact information for behavioral health clinic and was instructed to call 911 for emergencies.   Subjective:  Chief Complaint:  Chief Complaint  Patient presents with   Follow-up    Interval History:  Today the patient reports that his anxiety has decreased and that his mood remains normal most of the time.  He states that he is sleeping well.  He denies experiencing any hopelessness or suicidal thoughts.  He denies experiencing any hypomanic/manic symptoms.   Visit Diagnosis:    ICD-10-CM   1. Bipolar I disorder (HCC)  F31.9 QUEtiapine (SEROQUEL) 200 MG tablet    2. Bipolar affective disorder,  remission status unspecified (HCC)  F31.9 FLUoxetine (PROZAC) 10 MG capsule       Past Psychiatric History: Four psychiatric hospitalizations, the last occurring in 2011, no history of suicide attempt or self-injurious behavior per the report of the patient and review of the medical record.  Past Medical History:  Past Medical History:  Diagnosis Date   Bipolar 1 disorder (HCC)    Branch retinal vein occlusion    right eye   Diverticulitis    History of chicken pox    Hypertension    Mixed hyperlipidemia    Prediabetes    Sleep apnea     Past Surgical History:  Procedure Laterality Date   ABDOMINAL SURGERY     COLON RESECTION     COLOSTOMY REVERSAL      Family Psychiatric History: None pertinent  Family History:  Family History  Problem Relation Age of Onset   Hypertension Mother    Pancreatic cancer Father    Cancer Sister    Diabetes Sister    Hypertension Brother    Colon cancer Neg Hx    Colon polyps Neg Hx    Esophageal cancer Neg Hx    Stomach cancer Neg Hx    Rectal cancer Neg Hx     Social History:  Social History   Socioeconomic History   Marital status: Married    Spouse name: Not on file   Number of children: Not on file   Years of education: Not on file   Highest education level: Not on file  Occupational History   Not on file  Tobacco Use  Smoking status: Former    Current packs/day: 0.00    Types: Cigarettes    Quit date: 09/06/1998    Years since quitting: 24.3   Smokeless tobacco: Never   Tobacco comments:    states he was a social smoker, currently Vapes  Vaping Use   Vaping status: Never Used  Substance and Sexual Activity   Alcohol use: Yes    Comment: occasional   Drug use: No   Sexual activity: Not on file  Other Topics Concern   Not on file  Social History Narrative   Not on file   Social Determinants of Health   Financial Resource Strain: Not on file  Food Insecurity: Not on file  Transportation Needs: Not on file   Physical Activity: Not on file  Stress: Not on file  Social Connections: Not on file    Allergies:  Allergies  Allergen Reactions   Codeine     Triggers manic episode    Current Medications: Current Outpatient Medications  Medication Sig Dispense Refill   amLODipine (NORVASC) 10 MG tablet TAKE 1 TABLET(10 MG) BY MOUTH DAILY 90 tablet 0   atorvastatin (LIPITOR) 80 MG tablet Take 1 tablet (80 mg total) by mouth daily. 90 tablet 3   FLUoxetine (PROZAC) 10 MG capsule Take 1 capsule (10 mg total) by mouth daily. 30 capsule 3   losartan (COZAAR) 100 MG tablet TAKE 1 TABLET(100 MG) BY MOUTH DAILY 90 tablet 3   QUEtiapine (SEROQUEL) 200 MG tablet Take 1 tablet (200 mg total) by mouth at bedtime. 30 tablet 3   No current facility-administered medications for this visit.     Objective:  Psychiatric Specialty Exam: Physical Exam Constitutional:      Appearance: the patient is not toxic-appearing.  Pulmonary:     Effort: Pulmonary effort is normal.  Neurological:     General: No focal deficit present.     Mental Status: the patient is alert and oriented to person, place, and time.   Review of Systems  Respiratory:  Negative for shortness of breath.   Cardiovascular:  Negative for chest pain.  Gastrointestinal:  Negative for abdominal pain, constipation, diarrhea, nausea and vomiting.  Neurological:  Negative for headaches.      BP (!) 142/90   Wt 253 lb (114.8 kg)   BMI 36.30 kg/m   General Appearance: Fairly Groomed  Eye Contact:  Good  Speech:  Clear and Coherent  Volume:  Normal  Mood:  Euthymic  Affect:  Congruent  Thought Process:  Coherent  Orientation:  Full (Time, Place, and Person)  Thought Content: Logical   Suicidal Thoughts:  No  Homicidal Thoughts:  No  Memory:  Immediate;   Good  Judgement:  fair  Insight:  fair  Psychomotor Activity:  Normal  Concentration:  Concentration: Good  Recall:  Good  Fund of Knowledge: Good  Language: Good  Akathisia:   No  Handed:    AIMS (if indicated): not done  Assets:  Communication Skills Desire for Improvement Financial Resources/Insurance Housing Leisure Time Physical Health  ADL's:  Intact  Cognition: WNL  Sleep:  Fair     Metabolic Disorder Labs: Lab Results  Component Value Date   HGBA1C 5.7 11/02/2021   MPG 108 02/13/2020   No results found for: "PROLACTIN" Lab Results  Component Value Date   CHOL 125 11/07/2022   TRIG 137.0 11/07/2022   HDL 29.10 (L) 11/07/2022   CHOLHDL 4 11/07/2022   VLDL 27.4 11/07/2022   LDLCALC 69  11/07/2022   LDLCALC 97 05/10/2022   No results found for: "TSH"  Therapeutic Level Labs: Lab Results  Component Value Date   LITHIUM <0.25 (L) 04/11/2010   Lab Results  Component Value Date   VALPROATE 41.6 (L) 04/11/2010   No results found for: "CBMZ"  Screenings: AIMS    Flowsheet Row Office Visit from 09/13/2022 in [facility] Office Visit from 07/06/2022 in [facility]  AIMS Total Score 0 0      GAD-7    Flowsheet Row Office Visit from 07/06/2022 in [facility]  Total GAD-7 Score 1      PHQ2-9    Flowsheet Row Office Visit from 11/01/2022 in [facility] at [facility] Office Visit from 07/06/2022 in [facility] Office Visit from 10[facility]28/2021 in [facility] at [facility] Office Visit from 04/14/2021 in [facility] at [facility] Office Visit from 10/08/2020 in [facility] at [facility]  PHQ-2 Total Score 0 2 0 0 0  PHQ-9 Total Score 0 6 3 -- --       Collaboration of Care: none  A total of 30 minutes was spent involved in face to face clinical care, chart review, documentation.   Carlyn Reichert, MD 01/09/2023, 1:49 PM

## 2023-01-08 ENCOUNTER — Ambulatory Visit (HOSPITAL_BASED_OUTPATIENT_CLINIC_OR_DEPARTMENT_OTHER): Payer: No Typology Code available for payment source | Admitting: Student

## 2023-01-08 VITALS — BP 142/90 | Wt 253.0 lb

## 2023-01-08 DIAGNOSIS — Z79899 Other long term (current) drug therapy: Secondary | ICD-10-CM | POA: Diagnosis not present

## 2023-01-08 DIAGNOSIS — F319 Bipolar disorder, unspecified: Secondary | ICD-10-CM | POA: Diagnosis not present

## 2023-01-08 MED ORDER — QUETIAPINE FUMARATE 200 MG PO TABS
200.0000 mg | ORAL_TABLET | Freq: Every day | ORAL | 3 refills | Status: DC
Start: 2023-01-08 — End: 2023-04-23

## 2023-01-08 MED ORDER — FLUOXETINE HCL 10 MG PO CAPS: 10.0000 mg | ORAL_CAPSULE | Freq: Every day | ORAL | 3 refills | Status: DC

## 2023-01-10 DIAGNOSIS — Z79899 Other long term (current) drug therapy: Secondary | ICD-10-CM | POA: Insufficient documentation

## 2023-01-15 ENCOUNTER — Encounter: Payer: Self-pay | Admitting: Internal Medicine

## 2023-01-22 ENCOUNTER — Encounter: Payer: Self-pay | Admitting: Internal Medicine

## 2023-01-22 ENCOUNTER — Ambulatory Visit (AMBULATORY_SURGERY_CENTER): Payer: No Typology Code available for payment source | Admitting: Internal Medicine

## 2023-01-22 VITALS — BP 135/88 | HR 61 | Temp 98.4°F | Resp 11 | Ht 70.0 in | Wt 245.0 lb

## 2023-01-22 DIAGNOSIS — Z1211 Encounter for screening for malignant neoplasm of colon: Secondary | ICD-10-CM | POA: Diagnosis present

## 2023-01-22 DIAGNOSIS — D123 Benign neoplasm of transverse colon: Secondary | ICD-10-CM | POA: Diagnosis not present

## 2023-01-22 MED ORDER — SODIUM CHLORIDE 0.9 % IV SOLN: 500.0000 mL | INTRAVENOUS | Status: DC

## 2023-01-22 NOTE — Progress Notes (Signed)
[facility] History and Physical   Primary Care Physician:  Sharlene Dory, DO   Reason for Procedure:    Encounter Diagnosis  Name Primary?   Special screening for malignant neoplasms, colon Yes     Plan:    colonoscopy     HPI: Dale Hodges is a 52 y.o. male here for a screening colonoscopy   Past Medical History:  Diagnosis Date   Bipolar 1 disorder (HCC)    Branch retinal vein occlusion    right eye   Diverticulitis    History of chicken pox    Hypertension    Mixed hyperlipidemia    Prediabetes    Sleep apnea     Past Surgical History:  Procedure Laterality Date   ABDOMINAL SURGERY     COLON RESECTION     COLOSTOMY REVERSAL      Prior to Admission medications   Medication Sig Start Date End Date Taking? Authorizing Provider  amLODipine (NORVASC) 10 MG tablet TAKE 1 TABLET(10 MG) BY MOUTH DAILY 12/22/22  Yes Wendling, Jilda Roche, DO  atorvastatin (LIPITOR) 80 MG tablet Take 1 tablet (80 mg total) by mouth daily. 07/05/22  Yes Sharlene Dory, DO  FLUoxetine (PROZAC) 10 MG capsule Take 1 capsule (10 mg total) by mouth daily. 01/08/23  Yes Carlyn Reichert, MD  losartan (COZAAR) 100 MG tablet TAKE 1 TABLET(100 MG) BY MOUTH DAILY 09/15/22  Yes Sharlene Dory, DO  QUEtiapine (SEROQUEL) 200 MG tablet Take 1 tablet (200 mg total) by mouth at bedtime. 01/08/23  Yes Carlyn Reichert, MD    Current Outpatient Medications  Medication Sig Dispense Refill   amLODipine (NORVASC) 10 MG tablet TAKE 1 TABLET(10 MG) BY MOUTH DAILY 90 tablet 0   atorvastatin (LIPITOR) 80 MG tablet Take 1 tablet (80 mg total) by mouth daily. 90 tablet 3   FLUoxetine (PROZAC) 10 MG capsule Take 1 capsule (10 mg total) by mouth daily. 30 capsule 3   losartan (COZAAR) 100 MG tablet TAKE 1 TABLET(100 MG) BY MOUTH DAILY 90 tablet 3   QUEtiapine (SEROQUEL) 200 MG tablet Take 1 tablet (200 mg total) by mouth at bedtime. 30 tablet 3   Current  Facility-Administered Medications  Medication Dose Route Frequency Provider Last Rate Last Admin   0.9 %  sodium chloride infusion  500 mL Intravenous Continuous Iva Boop, MD        Allergies as of 01/22/2023 - Review Complete 01/22/2023  Allergen Reaction Noted   Codeine  01/12/2013    Family History  Problem Relation Age of Onset   Hypertension Mother    Pancreatic cancer Father    Cancer Sister    Diabetes Sister    Hypertension Brother    Colon cancer Neg Hx    Colon polyps Neg Hx    Esophageal cancer Neg Hx    Stomach cancer Neg Hx    Rectal cancer Neg Hx     Social History   Socioeconomic History   Marital status: Married    Spouse name: Not on file   Number of children: Not on file   Years of education: Not on file   Highest education level: Not on file  Occupational History   Not on file  Tobacco Use   Smoking status: Former    Current packs/day: 0.00    Types: Cigarettes    Quit date: 09/06/1998    Years since quitting: 24.3   Smokeless tobacco: Never   Tobacco comments:  states he was a social smoker, currently Vapes  Vaping Use   Vaping status: Never Used  Substance and Sexual Activity   Alcohol use: Yes    Comment: occasional   Drug use: No   Sexual activity: Not on file  Other Topics Concern   Not on file  Social History Narrative   Not on file   Social Determinants of Health   Financial Resource Strain: Not on file  Food Insecurity: Not on file  Transportation Needs: Not on file  Physical Activity: Not on file  Stress: Not on file  Social Connections: Not on file  Intimate Partner Violence: Not on file    Review of Systems:  All other review of systems negative except as mentioned in the HPI.  Physical Exam: Vital signs BP (!) 146/96   Pulse 69   Temp 98.4 F (36.9 C)   Resp 11   Ht 5\' 10"  (1.778 m)   Wt 245 lb (111.1 kg)   SpO2 97%   BMI 35.15 kg/m   General:   Alert,  Well-developed, well-nourished, pleasant  and cooperative in NAD Lungs:  Clear throughout to auscultation.   Heart:  Regular rate and rhythm; no murmurs, clicks, rubs,  or gallops. Abdomen:  Soft, nontender and nondistended. Normal bowel sounds.   Neuro/Psych:  Alert and cooperative. Normal mood and affect. A and O x 3   @Amore Grater  Sena Slate, MD, [facility] [phone] (pager) 01/22/2023 8:34 AM@

## 2023-01-22 NOTE — Op Note (Signed)
[facility] Patient Name: Dale Hodges Procedure Date: 01/22/2023 8:29 AM MRN: [id number] Endoscopist: Iva Boop , MD, [id number] Age: 52 Referring MD:  Date of Birth: Dec 21, 1970 Gender: Male Account #: [id number] Procedure:                Colonoscopy Indications:              Screening for colorectal malignant neoplasm, This                            is the patient's first colonoscopy Medicines:                Monitored Anesthesia Care Procedure:                Pre-Anesthesia Assessment:                           - Prior to the procedure, a History and Physical                            was performed, and patient medications and                            allergies were reviewed. The patient's tolerance of                            previous anesthesia was also reviewed. The risks                            and benefits of the procedure and the sedation                            options and risks were discussed with the patient.                            All questions were answered, and informed consent                            was obtained. Prior Anticoagulants: The patient has                            taken no anticoagulant or antiplatelet agents. ASA                            Grade Assessment: II - A patient with mild systemic                            disease. After reviewing the risks and benefits,                            the patient was deemed in satisfactory condition to                            undergo the procedure.  After obtaining informed consent, the colonoscope                            was passed under direct vision. Throughout the                            procedure, the patient's blood pressure, pulse, and                            oxygen saturations were monitored continuously. The                            Olympus Scope SN: [id number] was introduced through                            the anus and advanced to  the the cecum, identified                            by appendiceal orifice and ileocecal valve. The                            colonoscopy was performed without difficulty. The                            patient tolerated the procedure well. The quality                            of the bowel preparation was good. The ileocecal                            valve, appendiceal orifice, and rectum were                            photographed. The bowel preparation used was                            Miralax via split dose instruction. Scope In: 8:42:43 AM Scope Out: 8:54:10 AM Scope Withdrawal Time: 0 hours 9 minutes 36 seconds  Total Procedure Duration: 0 hours 11 minutes 27 seconds  Findings:                 The perianal and digital rectal examinations were                            normal.                           A diminutive polyp was found in the transverse                            colon. The polyp was sessile. The polyp was removed                            with a cold snare. Resection and retrieval were  complete. Verification of patient identification                            for the specimen was done. Estimated blood loss was                            minimal.                           Scattered diverticula were found in the descending                            colon and transverse colon.                           The exam was otherwise without abnormality on                            direct and retroflexion views. Complications:            No immediate complications. Estimated Blood Loss:     Estimated blood loss was minimal. Impression:               - One diminutive polyp in the transverse colon,                            removed with a cold snare. Resected and retrieved.                           - Diverticulosis in the descending colon and in the                            transverse colon.                           - The examination was  otherwise normal on direct                            and retroflexion views. Recommendation:           - Patient has a contact number available for                            emergencies. The signs and symptoms of potential                            delayed complications were discussed with the                            patient. Return to normal activities tomorrow.                            Written discharge instructions were provided to the                            patient.                           -  Resume previous diet.                           - Continue present medications.                           - Await pathology results.                           - Repeat colonoscopy is recommended. The                            colonoscopy date will be determined after pathology                            results from today's exam become available for                            review. Iva Boop, MD 01/22/2023 9:01:45 AM This report has been signed electronically.

## 2023-01-22 NOTE — Progress Notes (Signed)
Called to room to assist during endoscopic procedure.  Patient ID and intended procedure confirmed with present staff. Received instructions for my participation in the procedure from the performing physician.  

## 2023-01-22 NOTE — Progress Notes (Signed)
Uneventful anesthetic. Report to pacu rn. Vss. Care resumed by rn. 

## 2023-01-22 NOTE — Patient Instructions (Addendum)
I found and removed one tiny polyp that looks benign.  I saw some diverticulosis also.  I will let you know pathology results and when to have another routine colonoscopy by mail and/or My Chart.  I appreciate the opportunity to care for you. Iva Boop, MD, [facility]   Resume previous diet Continue present medications Await pathology results  Handouts/information given for polyps, diverticulosis   YOU HAD AN ENDOSCOPIC PROCEDURE TODAY AT THE [facility]:   Refer to the procedure report that was given to you for any specific questions about what was found during the examination.  If the procedure report does not answer your questions, please call your gastroenterologist to clarify.  If you requested that your care partner not be given the details of your procedure findings, then the procedure report has been included in a sealed envelope for you to review at your convenience later.  YOU SHOULD EXPECT: Some feelings of bloating in the abdomen. Passage of more gas than usual.  Walking can help get rid of the air that was put into your GI tract during the procedure and reduce the bloating. If you had a lower endoscopy (such as a colonoscopy or flexible sigmoidoscopy) you may notice spotting of blood in your stool or on the toilet paper. If you underwent a bowel prep for your procedure, you may not have a normal bowel movement for a few days.  Please Note:  You might notice some irritation and congestion in your nose or some drainage.  This is from the oxygen used during your procedure.  There is no need for concern and it should clear up in a day or so.  SYMPTOMS TO REPORT IMMEDIATELY:  Following lower endoscopy (colonoscopy):  Excessive amounts of blood in the stool  Significant tenderness or worsening of abdominal pains  Swelling of the abdomen that is new, acute  Fever of 100F or higher  For urgent or emergent issues, a gastroenterologist can be reached at any hour by  calling [phone]. Do not use MyChart messaging for urgent concerns.   DIET:  We do recommend a small meal at first, but then you may proceed to your regular diet.  Drink plenty of fluids but you should avoid alcoholic beverages for 24 hours.  ACTIVITY:  You should plan to take it easy for the rest of today and you should NOT DRIVE or use heavy machinery until tomorrow (because of the sedation medicines used during the test).    FOLLOW UP: Our staff will call the number listed on your records the next business day following your procedure.  We will call around 7:15- 8:00 am to check on you and address any questions or concerns that you may have regarding the information given to you following your procedure. If we do not reach you, we will leave a message.     If any biopsies were taken you will be contacted by phone or by letter within the next 1-3 weeks.  Please call us at [phone] if you have not heard about the biopsies in 3 weeks.   SIGNATURES/CONFIDENTIALITY: You and/or your care partner have signed paperwork which will be entered into your electronic medical record.  These signatures attest to the fact that that the information above on your After Visit Summary has been reviewed and is understood.  Full responsibility of the confidentiality of this discharge information lies with you and/or your care-partner.

## 2023-01-23 ENCOUNTER — Telehealth: Payer: Self-pay | Admitting: *Deleted

## 2023-01-23 NOTE — Telephone Encounter (Signed)
  Follow up Call-     01/22/2023    8:14 AM  Call back number  Post procedure Call Back phone  # [phone]  Permission to leave phone message Yes     Patient questions:  Do you have a fever, pain , or abdominal swelling? No. Pain Score  0 *  Have you tolerated food without any problems? Yes.    Have you been able to return to your normal activities? Yes.    Do you have any questions about your discharge instructions: Diet   No. Medications  No. Follow up visit  No.  Do you have questions or concerns about your Care? No.  Actions: * If pain score is 4 or above: No action needed, pain <4.

## 2023-01-24 LAB — SURGICAL PATHOLOGY

## 2023-01-28 ENCOUNTER — Encounter: Payer: Self-pay | Admitting: Internal Medicine

## 2023-03-28 ENCOUNTER — Other Ambulatory Visit: Payer: Self-pay | Admitting: Family Medicine

## 2023-03-28 DIAGNOSIS — I10 Essential (primary) hypertension: Secondary | ICD-10-CM

## 2023-04-23 ENCOUNTER — Telehealth (HOSPITAL_COMMUNITY): Payer: No Typology Code available for payment source | Admitting: Student

## 2023-04-23 DIAGNOSIS — Z79899 Other long term (current) drug therapy: Secondary | ICD-10-CM | POA: Diagnosis not present

## 2023-04-23 DIAGNOSIS — F319 Bipolar disorder, unspecified: Secondary | ICD-10-CM

## 2023-04-23 MED ORDER — FLUOXETINE HCL 10 MG PO CAPS: 10.0000 mg | ORAL_CAPSULE | Freq: Every day | ORAL | 3 refills | Status: DC

## 2023-04-23 MED ORDER — QUETIAPINE FUMARATE 200 MG PO TABS: 200.0000 mg | ORAL_TABLET | Freq: Every day | ORAL | 3 refills | Status: DC

## 2023-04-23 NOTE — Progress Notes (Signed)
 [facility] MD Outpatient Progress Note  04/23/2023 8:20 AM Dale Hodges  MRN:  [id number]  Televisit via video: I connected with Dale Hodges on 04/23/2023 at  8:00 AM EST by a video enabled telemedicine application and verified that I am speaking with the correct person using two identifiers.  Location: Patient: home Provider: home   I discussed the limitations of evaluation and management by telemedicine and the availability of in person appointments. The patient expressed understanding and agreed to proceed.  I discussed the assessment and treatment plan with the patient. The patient was provided an opportunity to ask questions and all were answered. The patient agreed with the plan and demonstrated an understanding of the instructions.   The patient was advised to call back or seek an in-person evaluation if the symptoms worsen or if the condition fails to improve as anticipated.  Assessment:  Dale Hodges presents for follow-up evaluation.  The patient reports that he is doing well, functioning well at work and home. No medication changes planned for today. Patient will follow up in 3 months.    Identifying Information: Dale Hodges is a 53 y.o. male with a history of bipolar affective disorder 1 who is an established patient with [facility] for management of depressive and hypomanic episodes.  The patient was last psychiatrically hospitalized in 2011 for mania.  The patient reports that previous manic episodes have involved decreased need for sleep, hyperreligious ideation and statements, racing thoughts, and disorganized behavior (during his first manic episode he walked into a stranger's house, resulting in his first psychiatric hospitalization).   Plan:  # Bipolar affective disorder 1 Interventions: - Continue Seroquel  200 mg nightly -- Risk of weight gain and TD discussed in detail (04/23/2023) -- Continue Prozac  at the present dose  of 10 mg daily  #Long term use of antipsychotic medication -- Lipid panel from July 2024, LDL 69 - A1c from July 2023, 5.7 primary care physician has agreed to update - Primary care physician has agreed to obtain EKG  Patient was given contact information for behavioral health clinic and was instructed to call 911 for emergencies.   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 He enjoyed the holiday with his wife and kids. The patient reports good mood, appetite, and sleep. He denies suicidal and homicidal thoughts. He denies side effects from his medications.  Review of systems as below. He denies experiencing symptoms of mania/hypomania. Education provided about risk of tardive dyskinesia and weight gain. The patient states he has not noticed any weight gain or involuntary movements.     Visit Diagnosis:    ICD-10-CM   1. Bipolar I disorder (HCC)  F31.9     2. Long term current use of antipsychotic medication  Z79.899         Past Psychiatric History: Four psychiatric hospitalizations, the last occurring in 2011, no history of suicide attempt or self-injurious behavior per the report of the patient and review of the medical record.  Past Medical History:  Past Medical History:  Diagnosis Date   Bipolar 1 disorder (HCC)    Branch retinal vein occlusion    right eye   Diverticulitis    History of chicken pox    Hypertension    Mixed hyperlipidemia    Prediabetes  Sleep apnea     Past Surgical History:  Procedure Laterality Date   ABDOMINAL SURGERY     COLON RESECTION     COLOSTOMY REVERSAL      Family Psychiatric History: None pertinent  Family History:  Family History  Problem Relation Age of Onset   Hypertension Mother    Pancreatic cancer Father    Cancer Sister    Diabetes Sister    Hypertension  Brother    Colon cancer Neg Hx    Colon polyps Neg Hx    Esophageal cancer Neg Hx    Stomach cancer Neg Hx    Rectal cancer Neg Hx     Social History:  Social History   Socioeconomic History   Marital status: Married    Spouse name: Not on file   Number of children: Not on file   Years of education: Not on file   Highest education level: Not on file  Occupational History   Not on file  Tobacco Use   Smoking status: Former    Current packs/day: 0.00    Types: Cigarettes    Quit date: 09/06/1998    Years since quitting: 24.6   Smokeless tobacco: Never   Tobacco comments:    states he was a social smoker, currently Vapes  Vaping Use   Vaping status: Never Used  Substance and Sexual Activity   Alcohol use: Yes    Comment: occasional   Drug use: No   Sexual activity: Not on file  Other Topics Concern   Not on file  Social History Narrative   Not on file   Social Drivers of Health   Financial Resource Strain: Not on file  Food Insecurity: Not on file  Transportation Needs: Not on file  Physical Activity: Not on file  Stress: Not on file  Social Connections: Not on file    Allergies:  Allergies  Allergen Reactions   Codeine     Triggers manic episode    Current Medications: Current Outpatient Medications  Medication Sig Dispense Refill   amLODipine  (NORVASC ) 10 MG tablet TAKE 1 TABLET(10 MG) BY MOUTH DAILY 90 tablet 0   atorvastatin  (LIPITOR) 80 MG tablet Take 1 tablet (80 mg total) by mouth daily. 90 tablet 3   FLUoxetine  (PROZAC ) 10 MG capsule Take 1 capsule (10 mg total) by mouth daily. 30 capsule 3   losartan  (COZAAR ) 100 MG tablet TAKE 1 TABLET(100 MG) BY MOUTH DAILY 90 tablet 3   QUEtiapine  (SEROQUEL ) 200 MG tablet Take 1 tablet (200 mg total) by mouth at bedtime. 30 tablet 3   No current facility-administered medications for this visit.     Objective:  Psychiatric Specialty Exam: Physical Exam Constitutional:      Appearance: the patient is  not toxic-appearing.  Pulmonary:     Effort: Pulmonary effort is normal.  Neurological:     General: No focal deficit present.     Mental Status: the patient is alert and oriented to person, place, and time.   Review of Systems  Respiratory:  Negative for shortness of breath.   Cardiovascular:  Negative for chest pain.  Gastrointestinal:  Negative for abdominal pain, constipation, diarrhea, nausea and vomiting.  Neurological:  Negative for headaches.      There were no vitals taken for this visit.  General Appearance: Fairly Groomed  Eye Contact:  Good  Speech:  Clear and Coherent  Volume:  Normal  Mood:  Euthymic  Affect:  Congruent  Thought Process:  Coherent  Orientation:  Full (Time, Place, and Person)  Thought Content: Logical   Suicidal Thoughts:  No  Homicidal Thoughts:  No  Memory:  Immediate;   Good  Judgement:  fair  Insight:  fair  Psychomotor Activity:  Normal  Concentration:  Concentration: Good  Recall:  Good  Fund of Knowledge: Good  Language: Good  Akathisia:  No  Handed:    AIMS (if indicated): not done  Assets:  Communication Skills Desire for Improvement Financial Resources/Insurance Housing Leisure Time Physical Health  ADL's:  Intact  Cognition: WNL  Sleep:  Fair     Metabolic Disorder Labs: Lab Results  Component Value Date   HGBA1C 5.7 11/02/2021   MPG 108 02/13/2020   No results found for: PROLACTIN Lab Results  Component Value Date   CHOL 125 11/07/2022   TRIG 137.0 11/07/2022   HDL 29.10 (L) 11/07/2022   CHOLHDL 4 11/07/2022   VLDL 27.4 11/07/2022   LDLCALC 69 11/07/2022   LDLCALC 97 05/10/2022   No results found for: TSH  Therapeutic Level Labs: Lab Results  Component Value Date   LITHIUM  <0.25 (L) 04/11/2010   Lab Results  Component Value Date   VALPROATE 41.6 (L) 04/11/2010   No results found for: CBMZ  Screenings: AIMS    Flowsheet Row [facility] Office Visit from [facility]06/2022 [facility]  AIMS Total Score 0 0      GAD-7    Flowsheet Row [facility] 07/06/2022 [facility]  Total GAD-7 Score 1      PHQ2-9    Flowsheet Row Office Visit from 11/01/2022 in [facility] Office Visit from 07/06/2022 [facility] Office Visit from 10/28/2021 in [facility] Office Visit from 04/14/2021 in [facility] at [facility] Office Visit from 10/08/2020 in [facility] at [facility]  PHQ-2 Total Score 0 2 0 0 0  PHQ-9 Total Score 0 6 3 -- --       Collaboration of Care: none  A total of 30 minutes was spent involved in face to face clinical care, chart review, documentation.   Karleen Kaufmann, MD 04/23/2023, 8:20 AM

## 2023-05-04 ENCOUNTER — Encounter: Payer: Self-pay | Admitting: Family Medicine

## 2023-05-04 ENCOUNTER — Ambulatory Visit (INDEPENDENT_AMBULATORY_CARE_PROVIDER_SITE_OTHER): Payer: No Typology Code available for payment source | Admitting: Family Medicine

## 2023-05-04 VITALS — BP 130/78 | HR 87 | Temp 98.0°F | Resp 16 | Ht 68.0 in | Wt 246.8 lb

## 2023-05-04 DIAGNOSIS — R7303 Prediabetes: Secondary | ICD-10-CM | POA: Diagnosis not present

## 2023-05-04 DIAGNOSIS — E782 Mixed hyperlipidemia: Secondary | ICD-10-CM

## 2023-05-04 DIAGNOSIS — F319 Bipolar disorder, unspecified: Secondary | ICD-10-CM | POA: Diagnosis not present

## 2023-05-04 DIAGNOSIS — Z23 Encounter for immunization: Secondary | ICD-10-CM | POA: Diagnosis not present

## 2023-05-04 DIAGNOSIS — I10 Essential (primary) hypertension: Secondary | ICD-10-CM

## 2023-05-04 LAB — COMPREHENSIVE METABOLIC PANEL
ALT: 30 U/L (ref 0–53)
AST: 16 U/L (ref 0–37)
Albumin: 4.2 g/dL (ref 3.5–5.2)
Alkaline Phosphatase: 64 U/L (ref 39–117)
BUN: 26 mg/dL — ABNORMAL HIGH (ref 6–23)
CO2: 30 meq/L (ref 19–32)
Calcium: 9 mg/dL (ref 8.4–10.5)
Chloride: 106 meq/L (ref 96–112)
Creatinine, Ser: 1.18 mg/dL (ref 0.40–1.50)
GFR: 71.77 mL/min (ref >60.00)
Glucose, Bld: 106 mg/dL — ABNORMAL HIGH (ref 70–99)
Potassium: 4 meq/L (ref 3.5–5.1)
Sodium: 144 meq/L (ref 135–145)
Total Bilirubin: 0.7 mg/dL (ref 0.2–1.2)
Total Protein: 6.6 g/dL (ref 6.0–8.3)

## 2023-05-04 LAB — HEMOGLOBIN A1C: Hgb A1c MFr Bld: 5.6 % (ref 4.6–6.5)

## 2023-05-04 LAB — LIPID PANEL
Cholesterol: 121 mg/dL (ref 0–200)
HDL: 23.6 mg/dL — ABNORMAL LOW (ref >39.00)
LDL Cholesterol: 66 mg/dL (ref 0–99)
NonHDL: 97.33
Total CHOL/HDL Ratio: 5
Triglycerides: 155 mg/dL — ABNORMAL HIGH (ref 0.0–149.0)
VLDL: 31 mg/dL (ref 0.0–40.0)

## 2023-05-04 NOTE — Addendum Note (Signed)
Addended by: Kathi Ludwig on: 05/04/2023 08:03 AM   Modules accepted: Orders

## 2023-05-04 NOTE — Patient Instructions (Addendum)
Give us 2-3 business days to get the results of your labs back.   Keep the diet clean and stay active.  Let us know if you need anything. 

## 2023-05-04 NOTE — Addendum Note (Signed)
Addended by: Kathi Ludwig on: 05/04/2023 07:49 AM   Modules accepted: Orders

## 2023-05-04 NOTE — Progress Notes (Signed)
Chief Complaint  Patient presents with   Medication Refill    Medication check    Subjective Dale Hodges is a 53 y.o. male who presents for hypertension follow up. He does monitor home blood pressures. Blood pressures ranging from 120's/80's on average. He is compliant with medications-losartan 100 mg daily, Norvasc 10 mg daily. Patient has these side effects of medication: none He is usually adhering to a healthy diet overall. Current exercise: walking, cycling No CP or SOB.   Hyperlipidemia Patient presents for hyperlipidemia follow up. Currently being treated with Lipitor 80 mg daily and compliance with treatment thus far has been good. He denies myalgias. Diet/exercise as above. The patient is not known to have coexisting coronary artery disease.   Past Medical History:  Diagnosis Date   Bipolar 1 disorder (HCC)    Branch retinal vein occlusion    right eye   Diverticulitis    History of chicken pox    Hypertension    Mixed hyperlipidemia    Prediabetes    Sleep apnea     Exam BP 130/78   Pulse 87   Temp 98 F (36.7 C) (Oral)   Resp 16   Ht 5\' 8"  (1.727 m)   Wt 246 lb 12.8 oz (111.9 kg)   SpO2 98%   BMI 37.53 kg/m  General:  well developed, well nourished, in no apparent distress Heart: RRR, no bruits, no LE edema Lungs: clear to auscultation, no accessory muscle use Psych: well oriented with normal range of affect and appropriate judgment/insight  Essential hypertension  Mixed hyperlipidemia - Plan: Comprehensive metabolic panel, Lipid panel  Prediabetes - Plan: Hemoglobin A1c  Bipolar I disorder (HCC)  Chronic, stable.  Continue Norvasc 10 mg daily, losartan 100 mg daily.  Counseled on diet and exercise. Chronic, hopefully stable.  Continue Lipitor 80 mg daily. Check labs as above. Will ck EKG for QT prolongation at request of psychiatry.  F/u in 6 months. The patient voiced understanding and agreement to the plan.  Jilda Roche  [place], DO 05/04/23  7:45 AM

## 2023-07-02 ENCOUNTER — Encounter: Payer: Self-pay | Admitting: Family Medicine

## 2023-07-02 ENCOUNTER — Ambulatory Visit (INDEPENDENT_AMBULATORY_CARE_PROVIDER_SITE_OTHER): Admitting: Family Medicine

## 2023-07-02 VITALS — BP 110/70 | HR 73 | Temp 98.8°F | Resp 18 | Ht 68.0 in | Wt 243.0 lb

## 2023-07-02 DIAGNOSIS — J0140 Acute pansinusitis, unspecified: Secondary | ICD-10-CM | POA: Diagnosis not present

## 2023-07-02 LAB — POC COVID19 BINAXNOW: SARS Coronavirus 2 Ag: NEGATIVE

## 2023-07-02 LAB — POC INFLUENZA A&B (BINAX/QUICKVUE)
Influenza A, POC: NEGATIVE
Influenza B, POC: NEGATIVE

## 2023-07-02 MED ORDER — AZELASTINE-FLUTICASONE 137-50 MCG/ACT NA SUSP: 1.0000 | Freq: Two times a day (BID) | NASAL | 5 refills | Status: AC

## 2023-07-02 MED ORDER — PROMETHAZINE-DM 6.25-15 MG/5ML PO SYRP: 5.0000 mL | ORAL_SOLUTION | Freq: Four times a day (QID) | ORAL | 0 refills | Status: DC | PRN

## 2023-07-02 MED ORDER — AMOXICILLIN-POT CLAVULANATE 875-125 MG PO TABS: 1.0000 | ORAL_TABLET | Freq: Two times a day (BID) | ORAL | 0 refills | Status: DC

## 2023-07-02 NOTE — Progress Notes (Signed)
 Established Patient Office Visit  Subjective   Patient ID: Dale Hodges, male    DOB: 10-27-1970  Age: 52 y.o. MRN: [id number]  Chief Complaint  Patient presents with   Cough    X6 days, pt reports cough, congestion, and sore throat. No fever.     HPI Discussed the use of AI scribe software for clinical note transcription with the patient, who gave verbal consent to proceed.  History of Present Illness   Dale Hodges is a 53 year old male who presents with severe congestion, headache, and cough.  He has been experiencing severe congestion and headache for the past six days. The congestion has progressed, with mucus turning green, and he has started coughing. No fever is reported, though he feels warm at times. He does not typically suffer from allergies and has not had similar symptoms for six or seven years.  He has been using a nasal spray, similar to Afrin, for relief but is concerned about rebound congestion if used for more than two or three days. For headaches, he has been taking ibuprofen. The congestion, initially confined to his nose, has started to move into his chest as of this morning, prompting him to seek medical attention. He uses a CPAP machine and is concerned about breathing difficulties, although he feels okay at the moment. No current sore throat, though it has been sore previously.  He has not conducted any flu or COVID tests. He is only allergic to codeine and does not have Flonase nasal spray at home. He has not experienced these symptoms annually, with the last occurrence being eight to ten years ago after moving from [place].  He has lived in various locations, including [place], [place], and [place], specifically in [place].      Patient Active Problem List   Diagnosis Date Noted   Long term current use of antipsychotic medication 01/10/2023   Bipolar I disorder  (HCC) 11/06/2022   Severe obesity (BMI 35.0-35.9 with comorbidity) (HCC) 02/18/2020   Obstructive sleep apnea 09/09/2018   Mixed hyperlipidemia    Prediabetes    Essential hypertension 05/12/2016   Past Medical History:  Diagnosis Date   Bipolar 1 disorder (HCC)    Branch retinal vein occlusion    right eye   Diverticulitis    History of chicken pox    Hypertension    Mixed hyperlipidemia    Prediabetes    Sleep apnea    Past Surgical History:  Procedure Laterality Date   ABDOMINAL SURGERY     COLON RESECTION     COLOSTOMY REVERSAL     Social History   Tobacco Use   Smoking status: Former    Current packs/day: 0.00    Types: Cigarettes    Quit date: 09/06/1998    Years since quitting: 24.8   Smokeless tobacco: Never   Tobacco comments:    states he was a social smoker, currently Vapes  Vaping Use   Vaping status: Never Used  Substance Use Topics   Alcohol use: Yes    Comment: occasional   Drug use: No   Social History   Socioeconomic History   Marital status: Married    Spouse name: Not on file   Number of children: Not on file   Years of education: Not on file   Highest education level: Not on file  Occupational History   Not on file  Tobacco Use   Smoking status: Former    Current packs/day: 0.00    Types: Cigarettes    Quit date: 09/06/1998    Years since quitting: 24.8   Smokeless tobacco: Never   Tobacco comments:    states he was a social smoker, currently Vapes  Vaping Use   Vaping status: Never Used  Substance and Sexual Activity   Alcohol use: Yes    Comment: occasional   Drug use: No   Sexual activity: Not on file  Other Topics Concern   Not on file  Social History Narrative   Not on file   Social Drivers of Health   Financial Resource Strain: Not on file  Food Insecurity: Not on file  Transportation Needs: Not on file  Physical Activity: Not on file  Stress: Not on file  Social Connections: Not on file  Intimate Partner  Violence: Not on file   Family Status  Relation Name Status   Mother  (Not Specified)   Father  Deceased   Sister  (Not Specified)   Brother  (Not Specified)   Neg Hx  (Not Specified)  No partnership data on file   Family History  Problem Relation Age of Onset   Hypertension Mother    Pancreatic cancer Father    Cancer Sister    Diabetes Sister    Hypertension Brother    Colon cancer Neg Hx    Colon polyps Neg Hx    Esophageal cancer Neg Hx    Stomach cancer Neg Hx    Rectal cancer Neg Hx    Allergies  Allergen Reactions   Codeine     Triggers manic episode      ROS    Objective:       BP 110/70 (BP Location: Left Arm, Patient Position: Sitting)   Pulse 73   Temp 98.8 F (37.1 C) (Oral)   Resp 18   Ht 5\' 8"  (1.727 m)   Wt 243 lb (110.2 kg)   SpO2 99%   BMI 36.95 kg/m  BP Readings from Last 3 Encounters:  07/02/23 110/70  05/04/23 130/78  01/22/23 135/88   Wt Readings from Last 3 Encounters:  07/02/23 243 lb (110.2 kg)  05/04/23 246 lb 12.8 oz (111.9 kg)  01/22/23 245 lb (111.1 kg)   SpO2 Readings from Last 3 Encounters:  07/02/23 99%  05/04/23 98%  01/22/23 98%      Physical Exam   Results for orders placed or performed in visit on 07/02/23  POC COVID-19  Result Value Ref Range   SARS Coronavirus 2 Ag Negative Negative  POC Influenza A&B (Binax test)  Result Value Ref Range   Influenza A, POC Negative Negative   Influenza B, POC Negative Negative    Last CBC Lab Results  Component Value Date   WBC 8.4 11/07/2022   HGB 14.8 11/07/2022   HCT 45.0 11/07/2022   MCV 91.9 11/07/2022   MCH 30.1 04/22/2016   RDW 13.9 11/07/2022   PLT 179.0 11/07/2022   Last metabolic panel Lab Results  Component Value Date   GLUCOSE 106 (H) 05/04/2023   NA 144 05/04/2023   K 4.0 05/04/2023   CL 106 05/04/2023   CO2 30 05/04/2023   BUN 26 (H) 05/04/2023   CREATININE 1.18 05/04/2023   GFR 71.77 05/04/2023   CALCIUM 9.0 05/04/2023   PROT 6.6  05/04/2023   ALBUMIN 4.2 05/04/2023   BILITOT 0.7 05/04/2023  ALKPHOS 64 05/04/2023   AST 16 05/04/2023   ALT 30 05/04/2023   ANIONGAP 7 04/22/2016   Last lipids Lab Results  Component Value Date   CHOL 121 05/04/2023   HDL 23.60 (L) 05/04/2023   LDLCALC 66 05/04/2023   LDLDIRECT 77.0 11/02/2021   TRIG 155.0 (H) 05/04/2023   CHOLHDL 5 05/04/2023   Last hemoglobin A1c Lab Results  Component Value Date   HGBA1C 5.6 05/04/2023   Last thyroid functions No results found for: "TSH", "T3TOTAL", "T4TOTAL", "THYROIDAB" Last vitamin D No results found for: "25OHVITD2", "25OHVITD3", "VD25OH" Last vitamin B12 and Folate No results found for: "VITAMINB12", "FOLATE"   Flu and covid negative   The ASCVD Risk score (Arnett DK, et al., 2019) failed to calculate for the following reasons:   The valid total cholesterol range is 130 to 320 mg/dL    Assessment & Plan:   Problem List Items Addressed This Visit   None Visit Diagnoses       Acute non-recurrent pansinusitis    -  Primary   Relevant Medications   promethazine-dextromethorphan (PROMETHAZINE-DM) 6.25-15 MG/5ML syrup   Azelastine-Fluticasone 137-50 MCG/ACT SUSP   amoxicillin-clavulanate (AUGMENTIN) 875-125 MG tablet   Other Relevant Orders   POC COVID-19 (Completed)   POC Influenza A&B (Binax test) (Completed)     Assessment and Plan    Acute Sinusitis   He presents with severe congestion, headache, and cough for six days, with green mucus indicating a possible bacterial infection. Although he reports no fever, he feels warm. Symptoms have progressed from nasal to chest involvement, suggesting either a viral upper respiratory infection or bacterial sinusitis. A nasal swab will be performed to rule out a viral infection. If the swab is negative for viral infection, prescribe an antibiotic. Prescribe cough syrup for nighttime use and Flonase nasal spray, advising against use for more than two to three days to prevent rebound  congestion.     No follow-ups on file.    Donato Schultz, DO

## 2023-07-02 NOTE — Patient Instructions (Signed)

## 2023-07-06 ENCOUNTER — Other Ambulatory Visit: Payer: Self-pay | Admitting: Family Medicine

## 2023-07-06 DIAGNOSIS — I10 Essential (primary) hypertension: Secondary | ICD-10-CM

## 2023-07-16 ENCOUNTER — Ambulatory Visit (HOSPITAL_BASED_OUTPATIENT_CLINIC_OR_DEPARTMENT_OTHER): Payer: No Typology Code available for payment source | Admitting: Student

## 2023-07-16 VITALS — BP 133/80 | HR 74 | Wt 237.0 lb

## 2023-07-16 DIAGNOSIS — F319 Bipolar disorder, unspecified: Secondary | ICD-10-CM | POA: Diagnosis not present

## 2023-07-16 DIAGNOSIS — Z79899 Other long term (current) drug therapy: Secondary | ICD-10-CM | POA: Diagnosis not present

## 2023-07-16 MED ORDER — QUETIAPINE FUMARATE 200 MG PO TABS: 200.0000 mg | ORAL_TABLET | Freq: Every day | ORAL | 3 refills | Status: DC

## 2023-07-16 MED ORDER — FLUOXETINE HCL 10 MG PO CAPS: 10.0000 mg | ORAL_CAPSULE | Freq: Every day | ORAL | 3 refills | Status: AC

## 2023-07-16 NOTE — Progress Notes (Signed)
 BH MD Outpatient Progress Note  07/16/2023 11:07 AM Dale Hodges  MRN:  [id number]  Assessment:  Dale Hodges presents for follow-up evaluation.  Today the patient reports he has been doing well.  The patient has had prolonged stability, and it seems it has been at least several years since his last mood episode.  It is appropriate for his care to be transitioned to his primary care provider, Dr. Carmelia Roller.  I messaged this provider about the transition and he (Dr Carmelia Roller) was agreeable.  The patient is scheduled for 69-month follow-up appointment with [place] in case this is necessary.  We would be more than happy to see the patient again in our clinic should this be necessary.  Identifying Information: Dale Hodges is a 53 y.o. male with a history of bipolar affective disorder 1 who is an established patient with [facility] for management of depressive and hypomanic episodes.  The patient was last psychiatrically hospitalized in 2011 for mania.  The patient reports that previous manic episodes have involved decreased need for sleep, hyperreligious ideation and statements, racing thoughts, and disorganized behavior (during his first manic episode he walked into a stranger's house, resulting in his first psychiatric hospitalization).   Plan:  # Bipolar affective disorder 1 Interventions: - Continue Seroquel 200 mg nightly -- Risk of weight gain and TD discussed in detail (04/23/2023) -- Continue Prozac at the present dose of 10 mg daily  #Long term use of antipsychotic medication -- A1c and lipid panel obtained 04/2023 - EKG performed 04/2023, unremarkable  Patient was given contact information for behavioral health clinic and was instructed to call 911 for emergencies.   Subjective:  Chief Complaint:  Chief Complaint  Patient presents with   Follow-up    Interval History:  Today the patient reports restful sleep and regular appetite.  He  reports a level of energy that is "fair".  He reports a predominantly euthymic mood.  He denies experiencing any suicidal thoughts.  He denies experiencing any symptoms of mania.  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  Visit Diagnosis:    ICD-10-CM   1. Bipolar I disorder (HCC)  F31.9 QUEtiapine (SEROQUEL) 200 MG tablet    FLUoxetine (PROZAC) 10 MG capsule    2. Long term current use of antipsychotic medication  Z79.899          Past Psychiatric History: Four psychiatric hospitalizations, the last occurring in 2011, no history of suicide attempt or self-injurious behavior per the report of the patient and review of the medical record.  Past Medical History:  Past Medical History:  Diagnosis Date   Bipolar 1 disorder (HCC)    Branch retinal vein occlusion    right eye   Diverticulitis    History of chicken pox    Hypertension    Mixed hyperlipidemia    Prediabetes    Sleep apnea     Past Surgical History:  Procedure Laterality Date   ABDOMINAL SURGERY     COLON RESECTION     COLOSTOMY REVERSAL      Family Psychiatric History: None pertinent  Family History:  Family History  Problem Relation Age of Onset   Hypertension Mother    Pancreatic cancer Father    Cancer Sister    Diabetes Sister    Hypertension Brother    Colon  cancer Neg Hx    Colon polyps Neg Hx    Esophageal cancer Neg Hx    Stomach cancer Neg Hx    Rectal cancer Neg Hx     Social History:  Social History   Socioeconomic History   Marital status: Married    Spouse name: Not on file   Number of children: Not on file   Years of education: Not on file   Highest education level: Not on file  Occupational History   Not on file  Tobacco Use   Smoking status: Former    Current packs/day: 0.00    Types: Cigarettes    Quit date: 09/06/1998    Years since  quitting: 24.8   Smokeless tobacco: Never   Tobacco comments:    states he was a social smoker, currently Vapes  Vaping Use   Vaping status: Never Used  Substance and Sexual Activity   Alcohol use: Yes    Comment: occasional   Drug use: No   Sexual activity: Not on file  Other Topics Concern   Not on file  Social History Narrative   Not on file   Social Drivers of Health   Financial Resource Strain: Not on file  Food Insecurity: Not on file  Transportation Needs: Not on file  Physical Activity: Not on file  Stress: Not on file  Social Connections: Not on file    Allergies:  Allergies  Allergen Reactions   Codeine     Triggers manic episode    Current Medications: Current Outpatient Medications  Medication Sig Dispense Refill   amLODipine (NORVASC) 10 MG tablet TAKE 1 TABLET(10 MG) BY MOUTH DAILY 90 tablet 0   amoxicillin-clavulanate (AUGMENTIN) 875-125 MG tablet Take 1 tablet by mouth 2 (two) times daily. 20 tablet 0   atorvastatin (LIPITOR) 80 MG tablet Take 1 tablet (80 mg total) by mouth daily. 90 tablet 3   Azelastine-Fluticasone 137-50 MCG/ACT SUSP Place 1 spray into the nose every 12 (twelve) hours. 23 g 5   FLUoxetine (PROZAC) 10 MG capsule Take 1 capsule (10 mg total) by mouth daily. 30 capsule 3   losartan (COZAAR) 100 MG tablet TAKE 1 TABLET(100 MG) BY MOUTH DAILY 90 tablet 3   promethazine-dextromethorphan (PROMETHAZINE-DM) 6.25-15 MG/5ML syrup Take 5 mLs by mouth 4 (four) times daily as needed. 118 mL 0   QUEtiapine (SEROQUEL) 200 MG tablet Take 1 tablet (200 mg total) by mouth at bedtime. 30 tablet 3   No current facility-administered medications for this visit.     Objective:  Psychiatric Specialty Exam: Physical Exam Constitutional:      Appearance: the patient is not toxic-appearing.  Pulmonary:     Effort: Pulmonary effort is normal.  Neurological:     General: No focal deficit present.     Mental Status: the patient is alert and oriented to  person, place, and time.   Review of Systems  Respiratory:  Negative for shortness of breath.   Cardiovascular:  Negative for chest pain.  Gastrointestinal:  Negative for abdominal pain, constipation, diarrhea, nausea and vomiting.  Neurological:  Negative for headaches.      BP 133/80   Pulse 74   Wt 237 lb (107.5 kg)   BMI 36.04 kg/m   General Appearance: Fairly Groomed  Eye Contact:  Good  Speech:  Clear and Coherent  Volume:  Normal  Mood:  Euthymic  Affect:  Congruent  Thought Process:  Coherent  Orientation:  Full (Time, Place, and Person)  Thought  Content: Logical   Suicidal Thoughts:  No  Homicidal Thoughts:  No  Memory:  Immediate;   Good  Judgement:  fair  Insight:  fair  Psychomotor Activity:  Normal  Concentration:  Concentration: Good  Recall:  Good  Fund of Knowledge: Good  Language: Good  Akathisia:  No  Handed:    AIMS (if indicated): not done  Assets:  Communication Skills Desire for Improvement Financial Resources/Insurance Housing Leisure Time Physical Health  ADL's:  Intact  Cognition: WNL  Sleep:  Fair     Metabolic Disorder Labs: Lab Results  Component Value Date   HGBA1C 5.6 05/04/2023   MPG 108 02/13/2020   No results found for: "PROLACTIN" Lab Results  Component Value Date   CHOL 121 05/04/2023   TRIG 155.0 (H) 05/04/2023   HDL 23.60 (L) 05/04/2023   CHOLHDL 5 05/04/2023   VLDL 31.0 05/04/2023   LDLCALC 66 05/04/2023   LDLCALC 69 11/07/2022   No results found for: "TSH"  Therapeutic Level Labs: Lab Results  Component Value Date   LITHIUM <0.25 (L) 04/11/2010   Lab Results  Component Value Date   VALPROATE 41.6 (L) 04/11/2010   No results found for: "CBMZ"  Screenings: AIMS    Flowsheet Row [facility] Visit from [facility]/20[facility] in [facility] Office Visit from 07[facility]06/2022 in [facility]  AIMS Total Score 0 0      GAD-7    Flowsheet Row  Office Visit from 07/06/2022 in [facility]  Total GAD-7 Score 1      PHQ2-9    Flowsheet Row Office Visit from 11/01/2022 in [facility] Office Visit from 07/06/2022 in [facility] Office Visit from 10/28/2021 in [facility] Office Visit from 04/14/2021 in [facility] Office Visit from 10/08/2020 in [facility] at [facility]  PHQ-2 Total Score 0 2 0 0 0  PHQ-9 Total Score 0 6 3 -- --       Collaboration of Care: none  A total of 30 minutes was spent involved in face to face clinical care, chart review, documentation.   Carlyn Reichert, MD 07/16/2023, 11:07 AM

## 2023-07-16 NOTE — Addendum Note (Signed)
 Addended by: Everlena Cooper on: 07/16/2023 12:00 PM   Modules accepted: Level of Service

## 2023-09-13 ENCOUNTER — Other Ambulatory Visit: Payer: Self-pay

## 2023-09-13 ENCOUNTER — Encounter: Payer: Self-pay | Admitting: Family Medicine

## 2023-09-13 MED ORDER — ATORVASTATIN CALCIUM 80 MG PO TABS: 80.0000 mg | ORAL_TABLET | Freq: Every day | ORAL | 3 refills | Status: AC

## 2023-10-05 ENCOUNTER — Encounter: Payer: Self-pay | Admitting: Family Medicine

## 2023-10-05 NOTE — Telephone Encounter (Signed)
 Message sent to Pt letting him know about medication managing.

## 2023-10-08 ENCOUNTER — Ambulatory Visit (HOSPITAL_COMMUNITY): Admitting: Student

## 2023-10-17 ENCOUNTER — Other Ambulatory Visit: Payer: Self-pay | Admitting: Family Medicine

## 2023-10-17 DIAGNOSIS — I10 Essential (primary) hypertension: Secondary | ICD-10-CM

## 2023-11-13 ENCOUNTER — Other Ambulatory Visit: Payer: Self-pay | Admitting: Family Medicine

## 2023-11-13 DIAGNOSIS — I10 Essential (primary) hypertension: Secondary | ICD-10-CM

## 2024-02-12 ENCOUNTER — Other Ambulatory Visit (INDEPENDENT_AMBULATORY_CARE_PROVIDER_SITE_OTHER)

## 2024-02-12 ENCOUNTER — Encounter: Payer: Self-pay | Admitting: Family Medicine

## 2024-02-12 ENCOUNTER — Ambulatory Visit: Payer: Self-pay | Admitting: Family Medicine

## 2024-02-12 ENCOUNTER — Ambulatory Visit (INDEPENDENT_AMBULATORY_CARE_PROVIDER_SITE_OTHER): Admitting: Family Medicine

## 2024-02-12 VITALS — BP 132/82 | HR 98 | Temp 98.0°F | Resp 16 | Ht 68.0 in | Wt 242.4 lb

## 2024-02-12 DIAGNOSIS — F319 Bipolar disorder, unspecified: Secondary | ICD-10-CM | POA: Diagnosis not present

## 2024-02-12 DIAGNOSIS — Z23 Encounter for immunization: Secondary | ICD-10-CM

## 2024-02-12 DIAGNOSIS — Z0000 Encounter for general adult medical examination without abnormal findings: Secondary | ICD-10-CM | POA: Diagnosis not present

## 2024-02-12 DIAGNOSIS — I10 Essential (primary) hypertension: Secondary | ICD-10-CM | POA: Diagnosis not present

## 2024-02-12 LAB — CBC
HCT: 42 % (ref 39.0–52.0)
Hemoglobin: 14.1 g/dL (ref 13.0–17.0)
MCHC: 33.7 g/dL (ref 30.0–36.0)
MCV: 91.9 fl (ref 78.0–100.0)
Platelets: 162 K/uL (ref 150.0–400.0)
RBC: 4.56 Mil/uL (ref 4.22–5.81)
RDW: 13.9 % (ref 11.5–15.5)
WBC: 6.2 K/uL (ref 4.0–10.5)

## 2024-02-12 LAB — COMPREHENSIVE METABOLIC PANEL WITH GFR
ALT: 35 U/L (ref 0–53)
AST: 18 U/L (ref 0–37)
Albumin: 4 g/dL (ref 3.5–5.2)
Alkaline Phosphatase: 63 U/L (ref 39–117)
BUN: 15 mg/dL (ref 6–23)
CO2: 31 meq/L (ref 19–32)
Calcium: 8.4 mg/dL (ref 8.4–10.5)
Chloride: 104 meq/L (ref 96–112)
Creatinine, Ser: 0.87 mg/dL (ref 0.40–1.50)
GFR: 99.81 mL/min (ref >60.00)
Glucose, Bld: 185 mg/dL — ABNORMAL HIGH (ref 70–99)
Potassium: 4 meq/L (ref 3.5–5.1)
Sodium: 141 meq/L (ref 135–145)
Total Bilirubin: 0.3 mg/dL (ref 0.2–1.2)
Total Protein: 6.1 g/dL (ref 6.0–8.3)

## 2024-02-12 LAB — LIPID PANEL
Cholesterol: 142 mg/dL (ref 0–200)
HDL: 25 mg/dL — ABNORMAL LOW (ref >39.00)
LDL Cholesterol: 57 mg/dL (ref 0–99)
NonHDL: 116.85
Total CHOL/HDL Ratio: 6
Triglycerides: 297 mg/dL — ABNORMAL HIGH (ref 0.0–149.0)
VLDL: 59.4 mg/dL — ABNORMAL HIGH (ref 0.0–40.0)

## 2024-02-12 MED ORDER — QUETIAPINE FUMARATE 200 MG PO TABS: 200.0000 mg | ORAL_TABLET | Freq: Every day | ORAL | 1 refills | Status: AC

## 2024-02-12 MED ORDER — AMLODIPINE BESYLATE 10 MG PO TABS: 10.0000 mg | ORAL_TABLET | Freq: Every day | ORAL | 1 refills | Status: AC

## 2024-02-12 NOTE — Progress Notes (Signed)
 Chief Complaint  Patient presents with   Annual Exam    CPE    Well Male Dale Hodges is here for a complete physical.   His last physical was >1 year ago.  Current diet: in general, a healthy diet.  Current exercise: active at work Weight trend: stable Fatigue out of ordinary? No. Seat belt? Yes.   Advanced directive? No  Health maintenance Shingrix- No Colonoscopy- Yes Tetanus- Yes HIV- Yes Hep C- Yes   Past Medical History:  Diagnosis Date   Bipolar 1 disorder (HCC)    Branch retinal vein occlusion (HCC)    right eye   Diverticulitis    History of chicken pox    Hypertension    Mixed hyperlipidemia    Prediabetes    Sleep apnea       Past Surgical History:  Procedure Laterality Date   ABDOMINAL SURGERY     COLON RESECTION     COLOSTOMY REVERSAL      Medications  Current Outpatient Medications on File Prior to Visit  Medication Sig Dispense Refill   atorvastatin  (LIPITOR) 80 MG tablet Take 1 tablet (80 mg total) by mouth daily. 90 tablet 3   Azelastine -Fluticasone  137-50 MCG/ACT SUSP Place 1 spray into the nose every 12 (twelve) hours. 23 g 5   FLUoxetine  (PROZAC ) 10 MG capsule Take 1 capsule (10 mg total) by mouth daily. 30 capsule 3   losartan  (COZAAR ) 100 MG tablet TAKE 1 TABLET(100 MG) BY MOUTH DAILY 90 tablet 3   Allergies Allergies  Allergen Reactions   Codeine     Triggers manic episode    Family History Family History  Problem Relation Age of Onset   Hypertension Mother    Pancreatic cancer Father    Cancer Sister    Diabetes Sister    Hypertension Brother    Colon cancer Neg Hx    Colon polyps Neg Hx    Esophageal cancer Neg Hx    Stomach cancer Neg Hx    Rectal cancer Neg Hx     Review of Systems: Constitutional:  no fevers Eye:  no recent significant change in vision Ear/Nose/Mouth/Throat:  Ears:  no hearing loss Nose/Mouth/Throat:  no complaints of nasal congestion, no sore throat Cardiovascular:  no chest  pain Respiratory:  no shortness of breath Gastrointestinal:  no change in bowel habits GU:  Male: negative for dysuria, frequency Musculoskeletal/Extremities:  no joint pain Integumentary (Skin/Breast):  no abnormal skin lesions reported Neurologic:  no headaches Endocrine: No unexpected weight changes Hematologic/Lymphatic:  no abnormal bleeding  Exam BP 132/82 (BP Location: Left Arm, Patient Position: Sitting)   Pulse 98   Temp 98 F (36.7 C) (Oral)   Resp 16   Ht 5' 8 (1.727 m)   Wt 242 lb 6.4 oz (110 kg)   SpO2 98%   BMI 36.86 kg/m  General:  well developed, well nourished, in no apparent distress Skin:  no significant moles, warts, or growths Head:  no masses, lesions, or tenderness Eyes:  pupils equal and round, sclera anicteric without injection Ears:  canals without lesions, TMs shiny without retraction, no obvious effusion, no erythema Nose:  nares patent, mucosa normal Throat/Pharynx:  lips and gingiva without lesion; tongue and uvula midline; non-inflamed pharynx; no exudates or postnasal drainage Neck: neck supple without adenopathy, thyromegaly, or masses Cardiac: RRR, no bruits, no LE edema Lungs:  clear to auscultation, breath sounds equal bilaterally, no respiratory distress Abdomen: BS+, soft, non-tender, non-distended, no masses or organomegaly  noted Rectal: Deferred Musculoskeletal:  symmetrical muscle groups noted without atrophy or deformity Neuro:  gait normal; deep tendon reflexes normal and symmetric Psych: well oriented with normal range of affect and appropriate judgment/insight  Assessment and Plan  Well adult exam - Plan: CBC, Comprehensive metabolic panel with GFR, Lipid panel, Hepatitis B surface antibody,quantitative  Need for influenza vaccination - Plan: Flu vaccine trivalent PF, 6mos and older(Flulaval,Afluria,Fluarix,Fluzone)  Bipolar I disorder (HCC) - Plan: QUEtiapine  (SEROQUEL ) 200 MG tablet  Essential hypertension - Plan: amLODipine   (NORVASC ) 10 MG tablet   Well 53 y.o. male. Counseled on diet and exercise. Counseled on risks and benefits of prostate cancer screening with PSA. The patient agrees to forego testing. Advanced directive form provided today.  Shingrix recommended.  Flu and PCV20 today.  Immunizations, labs, and further orders as above. Follow up in 6 mo. The patient voiced understanding and agreement to the plan.  Mabel [place], DO 02/12/24 9:06 AM

## 2024-02-12 NOTE — Patient Instructions (Addendum)

## 2024-02-13 ENCOUNTER — Ambulatory Visit: Payer: Self-pay | Admitting: Family Medicine

## 2024-02-13 ENCOUNTER — Other Ambulatory Visit (INDEPENDENT_AMBULATORY_CARE_PROVIDER_SITE_OTHER)

## 2024-02-13 ENCOUNTER — Ambulatory Visit

## 2024-02-13 DIAGNOSIS — R7303 Prediabetes: Secondary | ICD-10-CM | POA: Diagnosis not present

## 2024-02-13 LAB — HEPATITIS B SURFACE ANTIBODY, QUANTITATIVE: Hep B S AB Quant (Post): <5 m[IU]/mL — ABNORMAL LOW (ref >=10)

## 2024-02-13 LAB — HEMOGLOBIN A1C: Hgb A1c MFr Bld: 5.7 % (ref 4.6–6.5)

## 2024-08-12 ENCOUNTER — Ambulatory Visit: Admitting: Family Medicine
